# Patient Record
Sex: Male | Born: 1960 | Hispanic: Yes | Marital: Single | Smoking: Never smoker
Health system: Southern US, Community
[De-identification: ages and names within clinical notes are randomized; demographics above are authoritative.]

## PROBLEM LIST (undated history)

## (undated) DIAGNOSIS — I10 Essential (primary) hypertension: Secondary | ICD-10-CM

## (undated) DIAGNOSIS — E559 Vitamin D deficiency, unspecified: Secondary | ICD-10-CM

## (undated) DIAGNOSIS — E785 Hyperlipidemia, unspecified: Secondary | ICD-10-CM

## (undated) DIAGNOSIS — A159 Respiratory tuberculosis unspecified: Secondary | ICD-10-CM

## (undated) DIAGNOSIS — R17 Unspecified jaundice: Secondary | ICD-10-CM

## (undated) DIAGNOSIS — D751 Secondary polycythemia: Principal | ICD-10-CM

## (undated) HISTORY — DX: Essential (primary) hypertension: I10

## (undated) HISTORY — DX: Respiratory tuberculosis unspecified: A15.9

## (undated) HISTORY — DX: Unspecified jaundice: R17

## (undated) HISTORY — DX: Vitamin D deficiency, unspecified: E55.9

## (undated) HISTORY — DX: Secondary polycythemia: D75.1

## (undated) HISTORY — PX: HEMORRHOID SURGERY: SHX153

---

## 2002-06-08 ENCOUNTER — Encounter: Payer: Self-pay | Admitting: Emergency Medicine

## 2002-06-08 ENCOUNTER — Emergency Department (HOSPITAL_COMMUNITY): Admission: AD | Admit: 2002-06-08 | Discharge: 2002-06-08 | Payer: Self-pay | Admitting: Emergency Medicine

## 2006-07-21 ENCOUNTER — Emergency Department (HOSPITAL_COMMUNITY): Admission: EM | Admit: 2006-07-21 | Discharge: 2006-07-21 | Payer: Self-pay | Admitting: Emergency Medicine

## 2007-05-08 ENCOUNTER — Emergency Department (HOSPITAL_COMMUNITY): Admission: EM | Admit: 2007-05-08 | Discharge: 2007-05-08 | Payer: Self-pay | Admitting: Emergency Medicine

## 2008-01-28 ENCOUNTER — Emergency Department (HOSPITAL_COMMUNITY): Admission: EM | Admit: 2008-01-28 | Discharge: 2008-01-28 | Payer: Self-pay | Admitting: Emergency Medicine

## 2008-03-10 ENCOUNTER — Ambulatory Visit (HOSPITAL_COMMUNITY): Admission: RE | Admit: 2008-03-10 | Discharge: 2008-03-10 | Payer: Self-pay | Admitting: Family Medicine

## 2009-05-05 ENCOUNTER — Emergency Department (HOSPITAL_COMMUNITY): Admission: EM | Admit: 2009-05-05 | Discharge: 2009-05-06 | Payer: Self-pay | Admitting: Emergency Medicine

## 2010-04-12 LAB — URINALYSIS, ROUTINE W REFLEX MICROSCOPIC
Bilirubin Urine: NEGATIVE
Ketones, ur: NEGATIVE mg/dL
Nitrite: NEGATIVE
Urobilinogen, UA: 2 mg/dL — ABNORMAL HIGH (ref 0.0–1.0)
pH: 6.5 (ref 5.0–8.0)

## 2010-05-08 LAB — CBC
Hemoglobin: 16.1 g/dL (ref 13.0–17.0)
MCHC: 34.8 g/dL (ref 30.0–36.0)
MCV: 90.3 fL (ref 78.0–100.0)
Platelets: 227 10*3/uL (ref 150–400)

## 2010-05-08 LAB — DIFFERENTIAL
Lymphocytes Relative: 36 % (ref 12–46)
Lymphs Abs: 2.3 10*3/uL (ref 0.7–4.0)
Monocytes Relative: 11 % (ref 3–12)
Neutrophils Relative %: 52 % (ref 43–77)

## 2010-05-08 LAB — BASIC METABOLIC PANEL
CO2: 25 mEq/L (ref 19–32)
Calcium: 8.7 mg/dL (ref 8.4–10.5)
Chloride: 105 mEq/L (ref 96–112)
Glucose, Bld: 133 mg/dL — ABNORMAL HIGH (ref 70–99)
Sodium: 138 mEq/L (ref 135–145)

## 2010-05-08 LAB — POCT CARDIAC MARKERS
CKMB, poc: 2.6 ng/mL (ref 1.0–8.0)
Troponin i, poc: <0.05 ng/mL (ref 0.00–0.09)

## 2010-05-08 LAB — D-DIMER, QUANTITATIVE: D-Dimer, Quant: 0.22 ug/mL-FEU (ref 0.00–0.48)

## 2011-03-15 ENCOUNTER — Encounter: Payer: Self-pay | Admitting: Physician Assistant

## 2011-03-15 ENCOUNTER — Ambulatory Visit (INDEPENDENT_AMBULATORY_CARE_PROVIDER_SITE_OTHER): Payer: 59 | Admitting: Physician Assistant

## 2011-03-15 VITALS — BP 135/92 | HR 77 | Temp 98.3°F | Resp 16 | Ht 68.5 in | Wt 173.6 lb

## 2011-03-15 DIAGNOSIS — K648 Other hemorrhoids: Secondary | ICD-10-CM

## 2011-03-15 DIAGNOSIS — I10 Essential (primary) hypertension: Secondary | ICD-10-CM | POA: Insufficient documentation

## 2011-03-15 DIAGNOSIS — K649 Unspecified hemorrhoids: Secondary | ICD-10-CM | POA: Insufficient documentation

## 2011-03-15 LAB — POCT CBC
Granulocyte percent: 49.7 %G (ref 37–80)
HCT, POC: 49.8 % (ref 43.5–53.7)
Hemoglobin: 16.8 g/dL (ref 14.1–18.1)
POC Granulocyte: 4.3 (ref 2–6.9)
RBC: 5.38 M/uL (ref 4.69–6.13)

## 2011-03-15 LAB — IFOBT (OCCULT BLOOD): IFOBT: NEGATIVE

## 2011-03-15 MED ORDER — LISINOPRIL 10 MG PO TABS: 10.0000 mg | ORAL_TABLET | Freq: Every day | ORAL | Status: DC

## 2011-03-15 MED ORDER — IBUPROFEN 800 MG PO TABS: 800.0000 mg | ORAL_TABLET | Freq: Three times a day (TID) | ORAL | Status: DC | PRN

## 2011-03-15 NOTE — Progress Notes (Signed)
Patient ID: Kenneth Wilkerson MRN: [id number], DOB: 1961-01-16, 51 y.o. Date of Encounter: 03/15/2011, 5:09 PM  Primary Physician: No primary provider on file.  Chief Complaint: Hypertension follow up  HPI: 51 y.o. year old male with history below presents for follow up of hypertension. Out of lisinopril for 1 month. BP at home usually runs 120's/80's when he has his medication. No headache, CP, vision changes, or focal deficits. No ETOH, illicit substances, or tobacco use. Tries to eat healthy.  Patient also mentions history of hemorrhoids with BRBPR. None currently, although he did have some blood on his toilet tissue the previous week. No blood mixed with the stool. No pain. He has been constipated lately, having to strain with each BM. He reports having a colonoscopy 2-3 years prior for the same issue which showed hemorrhoids.     Past Medical History  Diagnosis Date  . Hypertension   . Hemorrhoids      Home Meds: Prior to Admission medications   Medication Sig Start Date End Date Taking? Authorizing Provider  ibuprofen (ADVIL,MOTRIN) 200 MG tablet Take 200 mg by mouth as needed.   Yes Historical Provider, MD  lisinopril (PRINIVIL,ZESTRIL) 10 MG tablet Take 10 mg by mouth daily.   Yes Historical Provider, MD    Allergies:  Allergies  Allergen Reactions  . Darvocet (Propoxyphene N-Acetaminophen) Other (See Comments)    HA, NERVOUSNESS  . Sulfa Antibiotics Other (See Comments)    Dizziness, shakey, sweats    History   Social History  . Marital Status: Married    Spouse Name: N/A    Number of Children: N/A  . Years of Education: N/A   Occupational History  . Not on file.   Social History Main Topics  . Smoking status: Not on file  . Smokeless tobacco: Not on file  . Alcohol Use: Not on file  . Drug Use: Not on file  . Sexually Active: Not on file   Other Topics Concern  . Not on file   Social History Narrative  . No narrative on file     Review of  Systems: Constitutional: negative for chills, fever, night sweats, weight changes, or fatigue  HEENT: negative for vision changes, hearing loss, congestion, rhinorrhea, ST, epistaxis, or sinus pressure Cardiovascular: negative for chest pain or palpitations Respiratory: negative for hemoptysis, wheezing, shortness of breath, or cough Abdominal: negative for abdominal pain, nausea, vomiting, or diarrhea Dermatological: negative for rash Neurologic: negative for headache, dizziness, or syncope All other systems reviewed and are otherwise negative with the exception to those above and in the HPI.   Physical Exam: Blood pressure 135/92, pulse 77, temperature 98.3 F (36.8 C), temperature source Oral, resp. rate 16, height 5' 8.5" (1.74 m), weight 173 lb 9.6 oz (78.744 kg)., Body mass index is 26.01 kg/(m^2). General: Well developed, well nourished, in no acute distress. Head: Normocephalic, atraumatic, eyes without discharge, sclera non-icteric, nares are without discharge. Bilateral auditory canals clear, TM's are without perforation, pearly grey and translucent with reflective cone of light bilaterally. Oral cavity moist, posterior pharynx without exudate, erythema, peritonsillar abscess, or post nasal drip.  Neck: Supple. No thyromegaly. Full ROM. No lymphadenopathy. Lungs: Clear bilaterally to auscultation without wheezes, rales, or rhonchi. Breathing is unlabored. Heart: RRR with S1 S2. No murmurs, rubs, or gallops appreciated. Abdomen: Soft, non-tender, non-distended with normoactive bowel sounds. No hepatosplenomegaly. No rebound/guarding. No obvious abdominal masses. Rectal: Prostate symmetrical without nodules. No frank blood on glove. Stool brown.  Msk:  Strength and tone normal for age. Extremities/Skin: Warm and dry. No clubbing or cyanosis. No edema. No rashes or suspicious lesions. Neuro: Alert and oriented X 3. Moves all extremities spontaneously. Gait is normal. CNII-XII grossly in  tact. Psych:  Responds to questions appropriately with a normal affect.   Labs: Results for orders placed in visit on 03/15/11  POCT CBC      Component Value Range   WBC 8.6  4.6 - 10.2 (K/uL)   Lymph, poc 3.7 (*) 0.6 - 3.4    POC LYMPH PERCENT 43.2  10 - 50 (%L)   MID (cbc) 0.6  0 - 0.9    POC MID % 7.1  0 - 12 (%M)   POC Granulocyte 4.3  2 - 6.9    Granulocyte percent 49.7  37 - 80 (%G)   RBC 5.38  4.69 - 6.13 (M/uL)   Hemoglobin 16.8  14.1 - 18.1 (g/dL)   HCT, POC 16.1  09.6 - 53.7 (%)   MCV 92.6  80 - 97 (fL)   MCH, POC 31.2  27 - 31.2 (pg)   MCHC 33.7  31.8 - 35.4 (g/dL)   RDW, POC 04.5     Platelet Count, POC 249  142 - 424 (K/uL)   MPV 8.5  0 - 99.8 (fL)  IFOBT (OCCULT BLOOD)      Component Value Range   IFOBT Negative      BMP pending  ASSESSMENT AND PLAN:  51 y.o. year old male with hypertension and 1. Hypertension -Restart lisinopril 10 mg #90 1 po daily RF 2 -Healthy diet and exercise -Await BMP -Recheck 6 months for CPE with fasting labs  2. Hemorrhoids -Stable/no active bleeding -Start Miralax -Push water -Declines GI eval  -If blood returns RTC for evaluation and treatment -RTC/ER precautions  Signed, Eula Listen, PA-C 03/15/2011 5:09 PM

## 2011-03-15 NOTE — Patient Instructions (Signed)

## 2011-03-16 LAB — BASIC METABOLIC PANEL
CO2: 22 mEq/L (ref 19–32)
Chloride: 103 mEq/L (ref 96–112)
Glucose, Bld: 85 mg/dL (ref 70–99)
Potassium: 4.3 mEq/L (ref 3.5–5.3)
Sodium: 137 mEq/L (ref 135–145)

## 2012-03-04 ENCOUNTER — Ambulatory Visit (INDEPENDENT_AMBULATORY_CARE_PROVIDER_SITE_OTHER): Payer: 59 | Admitting: Emergency Medicine

## 2012-03-04 VITALS — BP 148/87 | HR 69 | Temp 98.2°F | Resp 16 | Ht 68.25 in | Wt 170.2 lb

## 2012-03-04 DIAGNOSIS — I10 Essential (primary) hypertension: Secondary | ICD-10-CM

## 2012-03-04 DIAGNOSIS — R55 Syncope and collapse: Secondary | ICD-10-CM

## 2012-03-04 DIAGNOSIS — R300 Dysuria: Secondary | ICD-10-CM

## 2012-03-04 DIAGNOSIS — Z0000 Encounter for general adult medical examination without abnormal findings: Secondary | ICD-10-CM

## 2012-03-04 DIAGNOSIS — Z23 Encounter for immunization: Secondary | ICD-10-CM

## 2012-03-04 LAB — COMPREHENSIVE METABOLIC PANEL
ALT: 28 U/L (ref 0–53)
AST: 27 U/L (ref 0–37)
Albumin: 4.7 g/dL (ref 3.5–5.2)
Calcium: 9.4 mg/dL (ref 8.4–10.5)
Chloride: 102 mEq/L (ref 96–112)
Potassium: 4 mEq/L (ref 3.5–5.3)
Sodium: 135 mEq/L (ref 135–145)

## 2012-03-04 LAB — LIPID PANEL: Cholesterol: 184 mg/dL (ref 0–200)

## 2012-03-04 LAB — POCT URINALYSIS DIPSTICK
Ketones, UA: NEGATIVE
Leukocytes, UA: NEGATIVE
Protein, UA: NEGATIVE
Urobilinogen, UA: 0.2

## 2012-03-04 LAB — POCT CBC
Granulocyte percent: 51.2 %G (ref 37–80)
MCV: 94.7 fL (ref 80–97)
MID (cbc): 0.6 (ref 0–0.9)
POC Granulocyte: 3.3 (ref 2–6.9)
POC LYMPH PERCENT: 40.3 %L (ref 10–50)
Platelet Count, POC: 250 10*3/uL (ref 142–424)
RDW, POC: 14 %

## 2012-03-04 LAB — POCT UA - MICROSCOPIC ONLY
Crystals, Ur, HPF, POC: NEGATIVE
RBC, urine, microscopic: NEGATIVE

## 2012-03-04 LAB — TSH: TSH: 1.282 u[IU]/mL (ref 0.350–4.500)

## 2012-03-04 MED ORDER — LISINOPRIL 10 MG PO TABS: 20.0000 mg | ORAL_TABLET | Freq: Every day | ORAL | Status: DC

## 2012-03-04 MED ORDER — IBUPROFEN 800 MG PO TABS: 800.0000 mg | ORAL_TABLET | Freq: Three times a day (TID) | ORAL | Status: AC | PRN

## 2012-03-04 NOTE — Progress Notes (Signed)
[facility] [address] [place] [address] [phone]  Date:  03/04/2012   Name:  Kenneth Wilkerson   DOB:  09-20-1960   MRN:  [id number]  PCP:  No primary provider on file.    Chief Complaint: Annual Exam   History of Present Illness:  Kenneth Wilkerson is a 52 y.o. very pleasant male patient who presents with the following:  For wellness examination.  Tetanus out of date.  Has dysuria, urgency and frequency.  No fever or chills.  Some bilateral CVA burning.  Started last week.  No nausea or vomiting.  No symptoms prostatism.  Had a syncopal episode at Gym a month ago while working out.  Denies chest pain, just became dizzy and fell over.    Patient Active Problem List  Diagnosis  . Hypertension  . Hemorrhoids    Past Medical History  Diagnosis Date  . Hypertension   . Hemorrhoids     History reviewed. No pertinent past surgical history.  History  Substance Use Topics  . Smoking status: Never Smoker   . Smokeless tobacco: Not on file  . Alcohol Use: No    History reviewed. No pertinent family history.  Allergies  Allergen Reactions  . Darvocet (Propoxyphene-Acetaminophen) Other (See Comments)    HA, NERVOUSNESS  . Sulfa Antibiotics Other (See Comments)    Dizziness, shakey, sweats    Medication list has been reviewed and updated.  Current Outpatient Prescriptions on File Prior to Visit  Medication Sig Dispense Refill  . lisinopril (PRINIVIL,ZESTRIL) 10 MG tablet Take 1 tablet (10 mg total) by mouth daily.  90 tablet  2   No current facility-administered medications on file prior to visit.    Review of Systems:  As per HPI, otherwise negative.    Physical Examination: Filed Vitals:   03/04/12 0943  BP: 148/87  Pulse: 69  Temp: 98.2 F (36.8 C)  Resp: 16   Filed Vitals:   03/04/12 0943  Height: 5' 8.25" (1.734 m)  Weight: 170 lb 3.2 oz (77.202 kg)   Body mass index is 25.68 kg/(m^2). Ideal Body Weight:  Weight in (lb) to have BMI = 25: 165.3  GEN: WDWN, NAD, Non-toxic, A & O x 3 HEENT: Atraumatic, Normocephalic. Neck supple. No masses, No LAD. Ears and Nose: No external deformity. CV: RRR, No M/G/R. No JVD. No thrill. No extra heart sounds. PULM: CTA B, no wheezes, crackles, rhonchi. No retractions. No resp. distress. No accessory muscle use. ABD: S, NT, ND, +BS. No rebound. No HSM. EXTR: No c/c/e NEURO Normal gait.  PSYCH: Normally interactive. Conversant. Not depressed or anxious appearing.  Calm demeanor.  RECTAL:  Normal male circumcised.  Normal prostate.  Heme pending  Assessment and Plan: Wellness examination Hypertension Needs TDAP Needs colonosocopy Follow up based on labs Cardiology for stress test  Kenneth Dane, MD

## 2012-03-05 LAB — VITAMIN D 25 HYDROXY (VIT D DEFICIENCY, FRACTURES): Vit D, 25-Hydroxy: 21 ng/mL — ABNORMAL LOW (ref 30–89)

## 2012-03-06 NOTE — Progress Notes (Signed)
Reviewed and agree.

## 2012-03-07 MED ORDER — ERGOCALCIFEROL 1.25 MG (50000 UT) PO CAPS: 50000.0000 [IU] | ORAL_CAPSULE | ORAL | Status: DC

## 2012-03-07 NOTE — Addendum Note (Signed)
Addended by: Carmelina Dane on: 03/07/2012 10:01 AM   Modules accepted: Orders

## 2012-03-21 ENCOUNTER — Other Ambulatory Visit (HOSPITAL_COMMUNITY): Payer: Self-pay | Admitting: Cardiovascular Disease

## 2012-03-21 DIAGNOSIS — R079 Chest pain, unspecified: Secondary | ICD-10-CM

## 2012-03-21 DIAGNOSIS — R55 Syncope and collapse: Secondary | ICD-10-CM

## 2012-03-21 DIAGNOSIS — R011 Cardiac murmur, unspecified: Secondary | ICD-10-CM

## 2012-04-23 ENCOUNTER — Encounter (HOSPITAL_COMMUNITY): Payer: Self-pay

## 2012-04-23 ENCOUNTER — Ambulatory Visit (HOSPITAL_COMMUNITY): Payer: Self-pay

## 2012-12-16 ENCOUNTER — Ambulatory Visit (INDEPENDENT_AMBULATORY_CARE_PROVIDER_SITE_OTHER): Payer: 59 | Admitting: Physician Assistant

## 2012-12-16 VITALS — BP 124/84 | HR 87 | Temp 99.1°F | Resp 16 | Ht 68.3 in | Wt 182.2 lb

## 2012-12-16 DIAGNOSIS — J029 Acute pharyngitis, unspecified: Secondary | ICD-10-CM

## 2012-12-16 DIAGNOSIS — E785 Hyperlipidemia, unspecified: Secondary | ICD-10-CM

## 2012-12-16 DIAGNOSIS — R059 Cough, unspecified: Secondary | ICD-10-CM

## 2012-12-16 DIAGNOSIS — R05 Cough: Secondary | ICD-10-CM

## 2012-12-16 DIAGNOSIS — H6690 Otitis media, unspecified, unspecified ear: Secondary | ICD-10-CM

## 2012-12-16 LAB — COMPREHENSIVE METABOLIC PANEL
ALT: 23 U/L (ref 0–53)
AST: 24 U/L (ref 0–37)
Albumin: 4.6 g/dL (ref 3.5–5.2)
Alkaline Phosphatase: 83 U/L (ref 39–117)
BUN: 15 mg/dL (ref 6–23)
CO2: 26 mEq/L (ref 19–32)
Calcium: 9.4 mg/dL (ref 8.4–10.5)
Chloride: 99 mEq/L (ref 96–112)
Creat: 0.69 mg/dL (ref 0.50–1.35)
Glucose, Bld: 94 mg/dL (ref 70–99)
Potassium: 4.1 mEq/L (ref 3.5–5.3)
Sodium: 135 mEq/L (ref 135–145)
Total Bilirubin: 1.2 mg/dL (ref 0.3–1.2)
Total Protein: 8.2 g/dL (ref 6.0–8.3)

## 2012-12-16 LAB — LIPID PANEL: LDL Cholesterol: 95 mg/dL (ref 0–99)

## 2012-12-16 MED ORDER — AMOXICILLIN 875 MG PO TABS: 875.0000 mg | ORAL_TABLET | Freq: Two times a day (BID) | ORAL | Status: DC

## 2012-12-16 NOTE — Patient Instructions (Signed)
Get plenty of rest and drink at least 64 ounces of water daily. Use acetaminophen or ibuprofen for throat pain.

## 2012-12-16 NOTE — Progress Notes (Signed)
  [address]  [place], [place] [address]  [phone]  [email or web address]  Subjective:    Patient ID: Kenneth Wilkerson, male    DOB: 03-30-1960, 52 y.o.   MRN: [id number]  HPI  Chief Complaint  Patient presents with  . Sore Throat    3 days  . Cough   The cough is non-productive, and he has a little bit of nasal congestion and drainage.  He describes HA, soreness in the upper back and neck when he coughs.  No ear pain or fullness.  No SOB, dizziness.  No GI/GU symptoms. No other myalgias/arthralgias.  No rash.  Medications, allergies, past medical history, surgical history, family history, social history and problem list reviewed.  He requests fasting lipids today-has been told his cholesterol was too high by a provider elsewhere, and that he should work on healthier eating and exercise for several months before starting medication.   Review of Systems As above.    Objective:   Physical Exam  Blood pressure 124/84, pulse 87, temperature 99.1 F (37.3 C), temperature source Oral, resp. rate 16, height 5' 8.3" (1.735 m), weight 182 lb 3.2 oz (82.645 kg), SpO2 98.00%. Body mass index is 27.45 kg/(m^2). Well-developed, well nourished [place] man who is awake, alert and oriented, in NAD. HEENT: [place]/AT, PERRL, EOMI.  Sclera and conjunctiva are clear.  EAC are patent, RIGHT TM is normal in appearance. The LEFT TM is opaque in the upper field, with mild injection. Nasal mucosa is pink and moist. OP is clear. Neck: supple, non-tender, no lymphadenopathy, thyromegaly. Heart: RRR, no murmur Lungs: normal effort, CTA Extremities: no cyanosis, clubbing or edema. Skin: warm and dry without rash. Psychologic: good mood and appropriate affect, normal speech and behavior.       Assessment & Plan:  Acute pharyngitis  Cough  AOM (acute otitis media), left - Plan: amoxicillin (AMOXIL) 875 MG tablet  Hyperlipidemia - Plan: Lipid panel, Comprehensive metabolic panel  Get plenty of  rest and drink at least 64 ounces of water daily.  Fernande Bras, PA-C Physician Assistant-Certified [facility]

## 2012-12-18 ENCOUNTER — Emergency Department (HOSPITAL_COMMUNITY): Payer: 59

## 2012-12-18 ENCOUNTER — Encounter (HOSPITAL_COMMUNITY): Payer: Self-pay | Admitting: Emergency Medicine

## 2012-12-18 ENCOUNTER — Emergency Department (HOSPITAL_COMMUNITY)
Admission: EM | Admit: 2012-12-18 | Discharge: 2012-12-18 | Disposition: A | Discharge status: Home / Self Care | Payer: 59 | Attending: Emergency Medicine | Admitting: Emergency Medicine

## 2012-12-18 DIAGNOSIS — I10 Essential (primary) hypertension: Secondary | ICD-10-CM | POA: Insufficient documentation

## 2012-12-18 DIAGNOSIS — J111 Influenza due to unidentified influenza virus with other respiratory manifestations: Secondary | ICD-10-CM

## 2012-12-18 DIAGNOSIS — Z862 Personal history of diseases of the blood and blood-forming organs and certain disorders involving the immune mechanism: Secondary | ICD-10-CM | POA: Insufficient documentation

## 2012-12-18 DIAGNOSIS — R059 Cough, unspecified: Secondary | ICD-10-CM | POA: Insufficient documentation

## 2012-12-18 DIAGNOSIS — IMO0001 Reserved for inherently not codable concepts without codable children: Secondary | ICD-10-CM | POA: Insufficient documentation

## 2012-12-18 DIAGNOSIS — Z79899 Other long term (current) drug therapy: Secondary | ICD-10-CM | POA: Insufficient documentation

## 2012-12-18 DIAGNOSIS — Z792 Long term (current) use of antibiotics: Secondary | ICD-10-CM | POA: Insufficient documentation

## 2012-12-18 DIAGNOSIS — Z8639 Personal history of other endocrine, nutritional and metabolic disease: Secondary | ICD-10-CM | POA: Insufficient documentation

## 2012-12-18 DIAGNOSIS — Z8679 Personal history of other diseases of the circulatory system: Secondary | ICD-10-CM | POA: Insufficient documentation

## 2012-12-18 DIAGNOSIS — J029 Acute pharyngitis, unspecified: Secondary | ICD-10-CM | POA: Insufficient documentation

## 2012-12-18 DIAGNOSIS — R5381 Other malaise: Secondary | ICD-10-CM | POA: Insufficient documentation

## 2012-12-18 DIAGNOSIS — R05 Cough: Secondary | ICD-10-CM | POA: Insufficient documentation

## 2012-12-18 HISTORY — DX: Hyperlipidemia, unspecified: E78.5

## 2012-12-18 MED ORDER — HYDROCODONE-HOMATROPINE 5-1.5 MG/5ML PO SYRP
5.0000 mL | ORAL_SOLUTION | Freq: Once | ORAL | Status: AC
  Administered 2012-12-18: 5 mL via ORAL
  Filled 2012-12-18: qty 5

## 2012-12-18 MED ORDER — IBUPROFEN 600 MG PO TABS: 600.0000 mg | ORAL_TABLET | Freq: Four times a day (QID) | ORAL | Status: DC | PRN

## 2012-12-18 MED ORDER — HYDROCODONE-HOMATROPINE 5-1.5 MG/5ML PO SYRP: 5.0000 mL | ORAL_SOLUTION | Freq: Four times a day (QID) | ORAL | Status: DC | PRN

## 2012-12-18 MED ORDER — IBUPROFEN 400 MG PO TABS
600.0000 mg | ORAL_TABLET | Freq: Once | ORAL | Status: AC
  Administered 2012-12-18: 600 mg via ORAL
  Filled 2012-12-18: qty 2

## 2012-12-18 NOTE — ED Provider Notes (Signed)
CSN: [id number]     Arrival date & time 12/18/12  0117 History   First MD Initiated Contact with Patient 12/18/12 0158     No chief complaint on file.  (Consider location/radiation/quality/duration/timing/severity/associated sxs/prior Treatment) HPI Patient presents with one week of nonproductive cough, diffuse body aches, sore throat, fatigue. Was seen several days ago the urgent care clinic and prescribed amoxicillin. Patient states the symptoms have not improved since taking the amoxicillin. He's had no known sick contacts or recent international travel. He states he did not get the flu shot this year. He's had no nausea, vomiting or diarrhea. He denies any fevers or chills. He's had no neck pain or stiffness. Past Medical History  Diagnosis Date  . Hypertension   . Hemorrhoids   . Vitamin D deficiency   . Hyperlipidemia    Past Surgical History  Procedure Laterality Date  . Hemorrhoid surgery     History reviewed. No pertinent family history. History  Substance Use Topics  . Smoking status: Never Smoker   . Smokeless tobacco: Never Used  . Alcohol Use: 0 - 1 oz/week    0-2 drink(s) per week    Review of Systems  Constitutional: Positive for fatigue. Negative for fever and chills.  HENT: Positive for sore throat.   Respiratory: Positive for cough. Negative for chest tightness and shortness of breath.   Cardiovascular: Negative for chest pain, palpitations and leg swelling.  Gastrointestinal: Negative for nausea, vomiting, abdominal pain, diarrhea and constipation.  Genitourinary: Negative for dysuria and frequency.  Musculoskeletal: Positive for myalgias. Negative for back pain, neck pain and neck stiffness.  Skin: Negative for rash and wound.  Neurological: Negative for dizziness, syncope, weakness, numbness and headaches.  All other systems reviewed and are negative.    Allergies  Darvocet and Sulfa antibiotics  Home Medications   Current Outpatient Rx  Name   Route  Sig  Dispense  Refill  . amoxicillin (AMOXIL) 875 MG tablet   Oral   Take 1 tablet (875 mg total) by mouth 2 (two) times daily.   20 tablet   0   . lisinopril (PRINIVIL,ZESTRIL) 10 MG tablet   Oral   Take 2 tablets (20 mg total) by mouth daily.   90 tablet   1    BP 136/91  Pulse 86  Temp(Src) 98.5 F (36.9 C) (Oral)  Resp 20  Ht 5\' 7"  (1.702 m)  Wt 172 lb (78.019 kg)  BMI 26.93 kg/m2  SpO2 98% Physical Exam  Nursing note and vitals reviewed. Constitutional: He is oriented to person, place, and time. He appears well-developed and well-nourished. No distress.  HENT:  Head: Normocephalic and atraumatic.  Mouth/Throat: Oropharynx is clear and moist. No oropharyngeal exudate.  Erythematous posterior oropharynx without exudates or tonsillar hypertrophy.  Eyes: EOM are normal. Pupils are equal, round, and reactive to light.  Neck: Normal range of motion. Neck supple.  No meningismus  Cardiovascular: Normal rate and regular rhythm.   Pulmonary/Chest: Effort normal and breath sounds normal. No respiratory distress. He has no wheezes. He has no rales. He exhibits no tenderness.  Abdominal: Soft. Bowel sounds are normal. He exhibits no distension and no mass. There is no tenderness. There is no rebound and no guarding.  Musculoskeletal: Normal range of motion. He exhibits no edema and no tenderness.  No CVA tenderness.  Lymphadenopathy:    He has no cervical adenopathy.  Neurological: He is alert and oriented to person, place, and time.  Patient is alert and  oriented x3 with clear, goal oriented speech. Patient has 5/5 motor in all extremities. Sensation is intact to light touch. Patient has a normal gait and walks without assistance.   Skin: Skin is warm and dry. No rash noted. No erythema.  Psychiatric: He has a normal mood and affect. His behavior is normal.    ED Course  Procedures (including critical care time) Labs Review Labs Reviewed - No data to  display Imaging Review No results found.  EKG Interpretation   None       MDM  Likely viral illness such as influenza. Will check a chest x-ray to rule out pneumonia. We'll treat symptomatically.   Loren Racer, MD 12/19/12 0200

## 2012-12-18 NOTE — ED Notes (Signed)
Pt reporting cough and body aches for several days.  Saw PCP on 11/25.  Pt was started amoxicillin and reports no relief.

## 2013-03-23 ENCOUNTER — Ambulatory Visit (INDEPENDENT_AMBULATORY_CARE_PROVIDER_SITE_OTHER): Payer: 59 | Admitting: Emergency Medicine

## 2013-03-23 VITALS — BP 122/70 | HR 71 | Temp 97.8°F | Resp 16 | Ht 69.0 in | Wt 172.0 lb

## 2013-03-23 DIAGNOSIS — I10 Essential (primary) hypertension: Secondary | ICD-10-CM

## 2013-03-23 DIAGNOSIS — Z0000 Encounter for general adult medical examination without abnormal findings: Secondary | ICD-10-CM

## 2013-03-23 DIAGNOSIS — D751 Secondary polycythemia: Secondary | ICD-10-CM

## 2013-03-23 DIAGNOSIS — E785 Hyperlipidemia, unspecified: Secondary | ICD-10-CM

## 2013-03-23 LAB — POCT CBC
Granulocyte percent: 48.6 %G (ref 37–80)
HEMATOCRIT: 56.3 % — AB (ref 43.5–53.7)
Hemoglobin: 18.7 g/dL — AB (ref 14.1–18.1)
LYMPH, POC: 2.8 (ref 0.6–3.4)
MCH, POC: 32.2 pg — AB (ref 27–31.2)
MCHC: 33.2 g/dL (ref 31.8–35.4)
MCV: 96.9 fL (ref 80–97)
MID (CBC): 0.6 (ref 0–0.9)
MPV: 8.8 fL (ref 0–99.8)
PLATELET COUNT, POC: 246 10*3/uL (ref 142–424)
POC GRANULOCYTE: 3.2 (ref 2–6.9)
POC LYMPH %: 42.6 % (ref 10–50)
POC MID %: 8.8 %M (ref 0–12)
RBC: 5.81 M/uL (ref 4.69–6.13)
RDW, POC: 14.2 %
WBC: 6.6 10*3/uL (ref 4.6–10.2)

## 2013-03-23 LAB — POCT URINALYSIS DIPSTICK
Bilirubin, UA: NEGATIVE
Blood, UA: NEGATIVE
Glucose, UA: NEGATIVE
KETONES UA: NEGATIVE
Leukocytes, UA: NEGATIVE
Nitrite, UA: NEGATIVE
PH UA: 7
PROTEIN UA: NEGATIVE
SPEC GRAV UA: 1.015
Urobilinogen, UA: 0.2

## 2013-03-23 LAB — LIPID PANEL
CHOL/HDL RATIO: 4.1 ratio
CHOLESTEROL: 184 mg/dL (ref 0–200)
HDL: 45 mg/dL (ref >39)
LDL Cholesterol: 127 mg/dL — ABNORMAL HIGH (ref 0–99)
TRIGLYCERIDES: 60 mg/dL (ref <150)
VLDL: 12 mg/dL (ref 0–40)

## 2013-03-23 LAB — COMPREHENSIVE METABOLIC PANEL
ALT: 32 U/L (ref 0–53)
AST: 27 U/L (ref 0–37)
Albumin: 4.7 g/dL (ref 3.5–5.2)
Alkaline Phosphatase: 73 U/L (ref 39–117)
BILIRUBIN TOTAL: 2 mg/dL — AB (ref 0.2–1.2)
BUN: 22 mg/dL (ref 6–23)
CO2: 27 meq/L (ref 19–32)
CREATININE: 0.72 mg/dL (ref 0.50–1.35)
Calcium: 9.7 mg/dL (ref 8.4–10.5)
Chloride: 97 mEq/L (ref 96–112)
GLUCOSE: 71 mg/dL (ref 70–99)
Potassium: 4.4 mEq/L (ref 3.5–5.3)
Sodium: 135 mEq/L (ref 135–145)
Total Protein: 8.6 g/dL — ABNORMAL HIGH (ref 6.0–8.3)

## 2013-03-23 MED ORDER — LISINOPRIL 20 MG PO TABS: 20.0000 mg | ORAL_TABLET | Freq: Every day | ORAL | Status: DC

## 2013-03-23 MED ORDER — MELOXICAM 15 MG PO TABS: 15.0000 mg | ORAL_TABLET | Freq: Every day | ORAL | Status: DC

## 2013-03-23 NOTE — Patient Instructions (Signed)

## 2013-03-23 NOTE — Progress Notes (Addendum)
[facility] [address], [place] [id number] [phone]  Date:  03/23/2013   Name:  Kenneth SorrelSebastian R Shek   DOB:  10/05/1960   MRN:  [id number]  PCP:  Patrena Santalucia,CHELLE, PA-C    Chief Complaint: Annual Exam and Back Pain   History of Present Illness:  Kenneth SorrelSebastian R Rohlfs is a 53 y.o. very pleasant male patient who presents with the following:  For wellness exam.  Under treatment for hypertension.  Had colonoscopy but did not accomplish stress test due concerns about what he was required to pay.  No further syncopal episodes. Has pain in both elbows with lifting  No improvement with over the counter medications or other home remedies. Denies other complaint or health concern today.   Patient Active Problem List   Diagnosis Date Noted  . Hypertension   . Hemorrhoids     Past Medical History  Diagnosis Date  . Hypertension   . Hemorrhoids   . Vitamin D deficiency   . Hyperlipidemia   . Tuberculosis     Treated for in 2000    Past Surgical History  Procedure Laterality Date  . Hemorrhoid surgery      History  Substance Use Topics  . Smoking status: Never Smoker   . Smokeless tobacco: Never Used  . Alcohol Use: 0 - 1 oz/week    0-2 drink(s) per week    History reviewed. No pertinent family history.  Allergies  Allergen Reactions  . Darvocet [Propoxyphene N-Acetaminophen] Other (See Comments)    HA, NERVOUSNESS  . Sulfa Antibiotics Other (See Comments)    Dizziness, shakey, sweats    Medication list has been reviewed and updated.  Current Outpatient Prescriptions on File Prior to Visit  Medication Sig Dispense Refill  . ibuprofen (ADVIL,MOTRIN) 600 MG tablet Take 1 tablet (600 mg total) by mouth every 6 (six) hours as needed.  30 tablet  0  . amoxicillin (AMOXIL) 875 MG tablet Take 1 tablet (875 mg total) by mouth 2 (two) times daily.  20 tablet  0  . HYDROcodone-homatropine (HYCODAN) 5-1.5 MG/5ML syrup Take 5 mLs by mouth every 6 (six)  hours as needed for cough.  120 mL  0  . lisinopril (PRINIVIL,ZESTRIL) 10 MG tablet Take 2 tablets (20 mg total) by mouth daily.  90 tablet  1   No current facility-administered medications on file prior to visit.    Review of Systems:  As per HPI, otherwise negative.    Physical Examination: Filed Vitals:   03/23/13 0925  BP: 122/70  Pulse: 71  Temp: 97.8 F (36.6 C)  Resp: 16   Filed Vitals:   03/23/13 0925  Height: 5\' 9"  (1.753 m)  Weight: 172 lb (78.019 kg)   Body mass index is 25.39 kg/(m^2). Ideal Body Weight: Weight in (lb) to have BMI = 25: 168.9  GEN: WDWN, NAD, Non-toxic, A & O x 3 HEENT: Atraumatic, Normocephalic. Neck supple. No masses, No LAD. Ears and Nose: No external deformity. CV: RRR, No M/G/R. No JVD. No thrill. No extra heart sounds. PULM: CTA B, no wheezes, crackles, rhonchi. No retractions. No resp. distress. No accessory muscle use. ABD: S, NT, ND, +BS. No rebound. No HSM. EXTR: No c/c/e NEURO Normal gait.  PSYCH: Normally interactive. Conversant. Not depressed or anxious appearing.  Calm demeanor.    Assessment and Plan: Osteoarthritis Hypertension  Signed,  Phillips OdorJeffery Asheton Scheffler, MD   Results for orders placed in visit on 03/23/13  POCT CBC  Result Value Ref Range   WBC 6.6  4.6 - 10.2 K/uL   Lymph, poc 2.8  0.6 - 3.4   POC LYMPH PERCENT 42.6  10 - 50 %L   MID (cbc) 0.6  0 - 0.9   POC MID % 8.8  0 - 12 %M   POC Granulocyte 3.2  2 - 6.9   Granulocyte percent 48.6  37 - 80 %G   RBC 5.81  4.69 - 6.13 M/uL   Hemoglobin 18.7 (*) 14.1 - 18.1 g/dL   HCT, POC 16.1 (*) 09.6 - 53.7 %   MCV 96.9  80 - 97 fL   MCH, POC 32.2 (*) 27 - 31.2 pg   MCHC 33.2  31.8 - 35.4 g/dL   RDW, POC 04.5     Platelet Count, POC 246  142 - 424 K/uL   MPV 8.8  0 - 99.8 fL  POCT URINALYSIS DIPSTICK      Result Value Ref Range   Color, UA yellow     Clarity, UA clear     Glucose, UA neg     Bilirubin, UA neg     Ketones, UA neg     Spec Grav, UA 1.015      Blood, UA neg     pH, UA 7.0     Protein, UA neg     Urobilinogen, UA 0.2     Nitrite, UA neg     Leukocytes, UA Negative

## 2013-03-23 NOTE — Addendum Note (Signed)
Addended by: Carmelina DaneANDERSON, Gwenette Wellons S on: 03/23/2013 10:32 AM   Modules accepted: Orders

## 2013-03-25 ENCOUNTER — Telehealth: Payer: Self-pay | Admitting: Hematology and Oncology

## 2013-03-25 NOTE — Telephone Encounter (Signed)
Left pt vm in ref to np appt. °

## 2013-03-30 ENCOUNTER — Telehealth: Payer: Self-pay | Admitting: *Deleted

## 2013-03-30 NOTE — Telephone Encounter (Signed)
[facility] in [name] with a note dated 03-23-2013 from dr. Thornton PapasJeffrey Anderson for referral for polycythemia.  Instructed Barbara Cowerjason to give [facility] the [facility] [phone] number for him to call this office to schedule appointment.  Notified Hayes LudwigKim Hagerty who has been awaiting return call from patient.

## 2013-03-31 ENCOUNTER — Telehealth: Payer: Self-pay | Admitting: Hematology and Oncology

## 2013-03-31 NOTE — Telephone Encounter (Signed)
S/W PATIENT AND GAVE NEW PATIENT APPT FOR 03/17 @ 1:30 W/DR. GORSUCH REFERRING DR. Thornton PapasJEFFREY Wilkerson DX- POLYCYTHEMIA WELCOME PACKET MAILED.

## 2013-04-01 ENCOUNTER — Telehealth: Payer: Self-pay | Admitting: Hematology and Oncology

## 2013-04-01 NOTE — Telephone Encounter (Signed)
C/D 04/01/13 for appt. 04/07/13 °

## 2013-04-07 ENCOUNTER — Encounter: Payer: Self-pay | Admitting: Hematology and Oncology

## 2013-04-07 ENCOUNTER — Ambulatory Visit (HOSPITAL_BASED_OUTPATIENT_CLINIC_OR_DEPARTMENT_OTHER): Payer: 59 | Admitting: Hematology and Oncology

## 2013-04-07 ENCOUNTER — Ambulatory Visit (HOSPITAL_BASED_OUTPATIENT_CLINIC_OR_DEPARTMENT_OTHER): Payer: 59

## 2013-04-07 ENCOUNTER — Telehealth: Payer: Self-pay | Admitting: Hematology and Oncology

## 2013-04-07 ENCOUNTER — Telehealth: Payer: Self-pay | Admitting: *Deleted

## 2013-04-07 VITALS — BP 143/92 | HR 87 | Temp 98.3°F | Resp 18 | Ht 69.0 in | Wt 171.9 lb

## 2013-04-07 DIAGNOSIS — I10 Essential (primary) hypertension: Secondary | ICD-10-CM

## 2013-04-07 DIAGNOSIS — R17 Unspecified jaundice: Secondary | ICD-10-CM

## 2013-04-07 DIAGNOSIS — E559 Vitamin D deficiency, unspecified: Secondary | ICD-10-CM

## 2013-04-07 DIAGNOSIS — D751 Secondary polycythemia: Secondary | ICD-10-CM | POA: Insufficient documentation

## 2013-04-07 HISTORY — DX: Unspecified jaundice: R17

## 2013-04-07 HISTORY — DX: Secondary polycythemia: D75.1

## 2013-04-07 LAB — CBC WITH DIFFERENTIAL/PLATELET
BASO%: 0.3 % (ref 0.0–2.0)
Basophils Absolute: 0 10*3/uL (ref 0.0–0.1)
EOS%: 3.4 % (ref 0.0–7.0)
Eosinophils Absolute: 0.3 10*3/uL (ref 0.0–0.5)
HCT: 51.5 % — ABNORMAL HIGH (ref 38.4–49.9)
HGB: 17.6 g/dL — ABNORMAL HIGH (ref 13.0–17.1)
LYMPH%: 23.1 % (ref 14.0–49.0)
MCH: 30.9 pg (ref 27.2–33.4)
MCHC: 34.1 g/dL (ref 32.0–36.0)
MCV: 90.6 fL (ref 79.3–98.0)
MONO#: 1.3 10*3/uL — AB (ref 0.1–0.9)
MONO%: 13.4 % (ref 0.0–14.0)
NEUT#: 5.7 10*3/uL (ref 1.5–6.5)
NEUT%: 59.8 % (ref 39.0–75.0)
Platelets: 217 10*3/uL (ref 140–400)
RBC: 5.69 10*6/uL (ref 4.20–5.82)
RDW: 13 % (ref 11.0–14.6)
WBC: 9.6 10*3/uL (ref 4.0–10.3)
lymph#: 2.2 10*3/uL (ref 0.9–3.3)

## 2013-04-07 NOTE — Telephone Encounter (Signed)
gave pt appt for march and sent pt to labs today,left Marcelino DusterMichelle a vm regarding phlebotomy

## 2013-04-07 NOTE — Progress Notes (Signed)
[facility] CONSULT NOTE  Patient Care Team: Fernande Brashelle S [name], PA-C as PCP - General (Physician Assistant)  CHIEF COMPLAINTS/PURPOSE OF CONSULTATION:  Erythrocytosis  HISTORY OF PRESENTING ILLNESS:  Kenneth Wilkerson 53 y.o. male is here because of abnormal CBC. I have received recent blood work dated back to January 2010. His hemoglobin has varied from 16.1-18.7 with a hematocrit from 46.2-56.3. The patient complained of for a mild intermittent headaches. He also has occasional leg cramps. He denies any skin itching or night sweats. Denies prior diagnosis of blood clot. His girlfriend noted the patient snores in his sleep. There is also brief episode of intermittent episodes of apnea. The patient does not smoke. He does not use testosterone products. MEDICAL HISTORY:  Past Medical History  Diagnosis Date  . Hypertension   . Hemorrhoids   . Vitamin D deficiency   . Hyperlipidemia   . Tuberculosis     Treated for in 2000  . Erythrocytosis 04/07/2013  . Elevated bilirubin 04/07/2013    SURGICAL HISTORY: Past Surgical History  Procedure Laterality Date  . Hemorrhoid surgery      SOCIAL HISTORY: History   Social History  . Marital Status: Legally Separated    Spouse Name: N/A    Number of Children: 5  . Years of Education: 5th grade   Occupational History  . Housekeeping    Social History Main Topics  . Smoking status: Never Smoker   . Smokeless tobacco: Never Used  . Alcohol Use: 0 - 1 oz/week    0-2 drink(s) per week  . Drug Use: No  . Sexual Activity: Not on file   Other Topics Concern  . Not on file   Social History Narrative   Lives with his girlfriend.  His wife (they are separated) and 5 children live in [place].   [place]  Arrived in [place] in 1997.    FAMILY HISTORY: History reviewed. No pertinent family history.  ALLERGIES:  is allergic to amoxicillin; darvocet; and sulfa antibiotics.  MEDICATIONS:  Current  Outpatient Prescriptions  Medication Sig Dispense Refill  . ibuprofen (ADVIL,MOTRIN) 200 MG tablet Take 200 mg by mouth every 6 (six) hours as needed.      Marland Kitchen. lisinopril (PRINIVIL,ZESTRIL) 20 MG tablet Take 1 tablet (20 mg total) by mouth daily.  90 tablet  3   No current facility-administered medications for this visit.    REVIEW OF SYSTEMS:   Constitutional: Denies fevers, chills or abnormal night sweats Eyes: Denies blurriness of vision, double vision or watery eyes Ears, nose, mouth, throat, and face: Denies mucositis or sore throat Respiratory: Denies cough, dyspnea or wheezes Cardiovascular: Denies palpitation, chest discomfort or lower extremity swelling Gastrointestinal:  Denies nausea, heartburn or change in bowel habits Skin: Denies abnormal skin rashes Lymphatics: Denies new lymphadenopathy or easy bruising Neurological:Denies numbness, tingling or new weaknesses Behavioral/Psych: Mood is stable, no new changes  All other systems were reviewed with the patient and are negative.  PHYSICAL EXAMINATION: ECOG PERFORMANCE STATUS: 0 - Asymptomatic  Filed Vitals:   04/07/13 1328  BP: 143/92  Pulse: 87  Temp: 98.3 F (36.8 C)  Resp: 18   Filed Weights   04/07/13 1328  Weight: 171 lb 14.4 oz (77.973 kg)    GENERAL:alert, no distress and comfortable SKIN: skin color, texture, turgor are normal, no rashes or significant lesions EYES: normal, conjunctiva are pink and non-injected, sclera clear OROPHARYNX:no exudate, no erythema and lips, buccal mucosa, and tongue normal  NECK: supple, thyroid normal size, non-tender, without nodularity LYMPH:  no palpable lymphadenopathy in the cervical, axillary or inguinal LUNGS: clear to auscultation and percussion with normal breathing effort HEART: regular rate & rhythm and no murmurs and no lower extremity edema ABDOMEN:abdomen soft, non-tender and normal bowel sounds. No palpable splenomegaly Musculoskeletal:no cyanosis of digits  and no clubbing  PSYCH: alert & oriented x 3 with fluent speech NEURO: no focal motor/sensory deficits  LABORATORY DATA:  I have reviewed the data as listed Lab Results  Component Value Date   WBC 9.6 04/07/2013   HGB 17.6* 04/07/2013   HCT 51.5* 04/07/2013   MCV 90.6 04/07/2013   PLT 217 04/07/2013   Lab Results  Component Value Date   NA 135 03/23/2013   K 4.4 03/23/2013   CL 97 03/23/2013   CO2 27 03/23/2013   ASSESSMENT & PLAN:  #1 erythrocytosis Cause is unknown. I suspect he could have myeloproliferative disorder. I ordered a stat CBC my office and his hemoglobin came back 17.6. I will hold off phlebotomy for now. I told him to take aspirin 81 mg daily to prevent risk of blood clot. I will order an additional workup to rule out myeloproliferative disorder. We'll see him back in 2 weeks for further assessment.  Orders Placed This Encounter  Procedures  . Comprehensive metabolic panel    Standing Status: Future     Number of Occurrences: 1     Standing Expiration Date: 04/07/2014  . Bilirubin, direct    Standing Status: Future     Number of Occurrences: 1     Standing Expiration Date: 04/07/2014  . Erythropoietin    Standing Status: Future     Number of Occurrences: 1     Standing Expiration Date: 04/07/2014  . JAK2 genotypr    Standing Status: Future     Number of Occurrences: 1     Standing Expiration Date: 04/07/2014  . Uric Acid    Standing Status: Future     Number of Occurrences: 1     Standing Expiration Date: 04/07/2014  . Lactate dehydrogenase    Standing Status: Future     Number of Occurrences: 1     Standing Expiration Date: 04/07/2014  . CBC with Differential    Standing Status: Standing     Number of Occurrences: 2     Standing Expiration Date: 04/08/2014    All questions were answered. The patient knows to call the clinic with any problems, questions or concerns. I spent 40 minutes counseling the patient face to face. The total time spent in the  appointment was 55 minutes and more than 50% was on counseling.     Twania Bujak, MD 04/07/2013 3:50 PM

## 2013-04-07 NOTE — Telephone Encounter (Signed)
Called Kenneth Wilkerson and left VM asking if [facility] before 4 pm for more lab work.  The lab only drew a CBC although there were more labs Dr. Bertis RuddyGorsuch ordered.  Asked him to call back to Dr. Maxine GlennGorsuch's nurse if he cannot make it back.   (After this call,  Dr. Bertis RuddyGorsuch informs that she was able to get the Jak 2 added on to blood drawn today and Kenneth Wilkerson can have the rest of his labs done on his next visit.)

## 2013-04-19 ENCOUNTER — Other Ambulatory Visit: Payer: Self-pay | Admitting: Hematology and Oncology

## 2013-04-19 DIAGNOSIS — R17 Unspecified jaundice: Secondary | ICD-10-CM

## 2013-04-19 DIAGNOSIS — D751 Secondary polycythemia: Secondary | ICD-10-CM

## 2013-04-20 ENCOUNTER — Encounter: Payer: Self-pay | Admitting: Hematology and Oncology

## 2013-04-20 ENCOUNTER — Ambulatory Visit (HOSPITAL_BASED_OUTPATIENT_CLINIC_OR_DEPARTMENT_OTHER): Payer: 59 | Admitting: Hematology and Oncology

## 2013-04-20 ENCOUNTER — Telehealth: Payer: Self-pay | Admitting: Hematology and Oncology

## 2013-04-20 ENCOUNTER — Other Ambulatory Visit (HOSPITAL_BASED_OUTPATIENT_CLINIC_OR_DEPARTMENT_OTHER): Payer: 59

## 2013-04-20 VITALS — BP 125/82 | HR 79 | Temp 98.3°F | Resp 20 | Ht 69.0 in | Wt 168.2 lb

## 2013-04-20 DIAGNOSIS — R17 Unspecified jaundice: Secondary | ICD-10-CM

## 2013-04-20 DIAGNOSIS — D751 Secondary polycythemia: Secondary | ICD-10-CM

## 2013-04-20 LAB — CBC WITH DIFFERENTIAL/PLATELET
BASO%: 0.3 % (ref 0.0–2.0)
BASOS ABS: 0 10*3/uL (ref 0.0–0.1)
EOS ABS: 0.1 10*3/uL (ref 0.0–0.5)
EOS%: 1.2 % (ref 0.0–7.0)
HCT: 52.5 % — ABNORMAL HIGH (ref 38.4–49.9)
HEMOGLOBIN: 17.7 g/dL — AB (ref 13.0–17.1)
LYMPH%: 34.7 % (ref 14.0–49.0)
MCH: 30.7 pg (ref 27.2–33.4)
MCHC: 33.7 g/dL (ref 32.0–36.0)
MCV: 91 fL (ref 79.3–98.0)
MONO#: 0.7 10*3/uL (ref 0.1–0.9)
MONO%: 9.6 % (ref 0.0–14.0)
NEUT#: 4.1 10*3/uL (ref 1.5–6.5)
NEUT%: 54.2 % (ref 39.0–75.0)
PLATELETS: 269 10*3/uL (ref 140–400)
RBC: 5.77 10*6/uL (ref 4.20–5.82)
RDW: 13.4 % (ref 11.0–14.6)
WBC: 7.5 10*3/uL (ref 4.0–10.3)
lymph#: 2.6 10*3/uL (ref 0.9–3.3)

## 2013-04-20 LAB — COMPREHENSIVE METABOLIC PANEL (CC13)
ALBUMIN: 4.4 g/dL (ref 3.5–5.0)
ALK PHOS: 79 U/L (ref 40–150)
ALT: 38 U/L (ref 0–55)
AST: 34 U/L (ref 5–34)
Anion Gap: 10 mEq/L (ref 3–11)
BUN: 18.4 mg/dL (ref 7.0–26.0)
CO2: 26 mEq/L (ref 22–29)
Calcium: 9.6 mg/dL (ref 8.4–10.4)
Chloride: 102 mEq/L (ref 98–109)
Creatinine: 0.8 mg/dL (ref 0.7–1.3)
GLUCOSE: 70 mg/dL (ref 70–140)
POTASSIUM: 4 meq/L (ref 3.5–5.1)
SODIUM: 138 meq/L (ref 136–145)
TOTAL PROTEIN: 8.4 g/dL — AB (ref 6.4–8.3)
Total Bilirubin: 1.48 mg/dL — ABNORMAL HIGH (ref 0.20–1.20)

## 2013-04-20 LAB — LACTATE DEHYDROGENASE (CC13): LDH: 160 U/L (ref 125–245)

## 2013-04-20 LAB — URIC ACID (CC13): Uric Acid, Serum: 6.3 mg/dl (ref 2.6–7.4)

## 2013-04-20 LAB — BILIRUBIN, DIRECT: Bilirubin, Direct: 0.2 mg/dL (ref 0.0–0.3)

## 2013-04-20 NOTE — Progress Notes (Signed)
[facility] OFFICE PROGRESS NOTE  [name],CHELLE, PA-C DIAGNOSIS:  Erythrocytosis, likely related to undiagnosed obstructive sleep apnea  SUMMARY OF HEMATOLOGIC HISTORY: This patient was referred here because of abnormal CBC. He has chronic elevated hemoglobin from 16.1-18.7. Peripheral blood test for JAK 2 mutation was negative. His girlfriend noted the patient snores excessively at night and the patient has significant nasal congestion all the time. INTERVAL HISTORY: Discussion is done via an interpreter Kenneth Wilkerson 53 y.o. male returns for further followup. He was started on aspirin. He denies any recent bleeding or bruising. I have reviewed the past medical history, past surgical history, social history and family history with the patient and they are unchanged from previous note.  ALLERGIES:  is allergic to amoxicillin; darvocet; and sulfa antibiotics.  MEDICATIONS:  Current Outpatient Prescriptions  Medication Sig Dispense Refill  . aspirin 81 MG tablet Take 81 mg by mouth daily.      Marland Kitchen ibuprofen (ADVIL,MOTRIN) 200 MG tablet Take 200 mg by mouth every 6 (six) hours as needed.      Marland Kitchen lisinopril (PRINIVIL,ZESTRIL) 20 MG tablet Take 1 tablet (20 mg total) by mouth daily.  90 tablet  3   No current facility-administered medications for this visit.     REVIEW OF SYSTEMS:   All other systems were reviewed with the patient and are negative.  PHYSICAL EXAMINATION: ECOG PERFORMANCE STATUS: 0 - Asymptomatic  Filed Vitals:   04/20/13 1046  BP: 125/82  Pulse: 79  Temp: 98.3 F (36.8 C)  Resp: 20   Filed Weights   04/20/13 1046  Weight: 168 lb 3.2 oz (76.295 kg)    GENERAL:alert, no distress and comfortable SKIN: skin color, texture, turgor are normal, no rashes or significant lesions Musculoskeletal:no cyanosis of digits and no clubbing  NEURO: alert & oriented x 3 with fluent speech, no focal motor/sensory deficits  LABORATORY DATA:  I have  reviewed the data as listed Results for orders placed in visit on 04/20/13 (from the past 48 hour(s))  CBC WITH DIFFERENTIAL     Status: Abnormal   Collection Time    04/20/13 10:30 AM      Result Value Ref Range   WBC 7.5  4.0 - 10.3 10e3/uL   NEUT# 4.1  1.5 - 6.5 10e3/uL   HGB 17.7 (*) 13.0 - 17.1 g/dL   HCT 16.1 (*) 09.6 - 04.5 %   Platelets 269  140 - 400 10e3/uL   MCV 91.0  79.3 - 98.0 fL   MCH 30.7  27.2 - 33.4 pg   MCHC 33.7  32.0 - 36.0 g/dL   RBC 4.09  8.11 - 9.14 10e6/uL   RDW 13.4  11.0 - 14.6 %   lymph# 2.6  0.9 - 3.3 10e3/uL   MONO# 0.7  0.1 - 0.9 10e3/uL   Eosinophils Absolute 0.1  0.0 - 0.5 10e3/uL   Basophils Absolute 0.0  0.0 - 0.1 10e3/uL   NEUT% 54.2  39.0 - 75.0 %   LYMPH% 34.7  14.0 - 49.0 %   MONO% 9.6  0.0 - 14.0 %   EOS% 1.2  0.0 - 7.0 %   BASO% 0.3  0.0 - 2.0 %    Lab Results  Component Value Date   WBC 7.5 04/20/2013   HGB 17.7* 04/20/2013   HCT 52.5* 04/20/2013   MCV 91.0 04/20/2013   PLT 269 04/20/2013   ASSESSMENT & PLAN:  #1 secondary erythrocytosis #2 Probable undiagnosed obstructive sleep apnea Peripheral blood  for JAK 2 mutation was negative. I suspect the cause of his erythrocytosis is secondary to undiagnosed obstructive sleep apnea. I recommend evaluation by a pulmonologist. There is no benefit of doing phlebotomy in this situation. Erythrocytosis is his compensatory mechanism. I recommend him to take aspirin to prevent risk of blood clot. I have not make a return appointment for the patient to come back. All questions were answered. The patient knows to call the clinic with any problems, questions or concerns. No barriers to learning was detected.  I spent 15 minutes counseling the patient face to face. The total time spent in the appointment was 20 minutes and more than 50% was on counseling.     Briston Lax, MD 04/20/2013 11:07 AM

## 2013-04-20 NOTE — Telephone Encounter (Signed)
gv adn printed appt sched and avs for pt April 20th @ 3pm with Dr. Craige CottaSood

## 2013-04-22 LAB — ERYTHROPOIETIN: Erythropoietin: 6.3 m[IU]/mL (ref 2.6–18.5)

## 2013-05-11 ENCOUNTER — Institutional Professional Consult (permissible substitution): Payer: 59 | Admitting: Pulmonary Disease

## 2013-05-19 MED ORDER — PROPOFOL 10 MG/ML IV BOLUS
INTRAVENOUS | Status: AC
  Filled 2013-05-19: qty 20

## 2013-05-19 MED ORDER — FENTANYL CITRATE 0.05 MG/ML IJ SOLN
INTRAMUSCULAR | Status: AC
  Filled 2013-05-19: qty 2

## 2013-05-19 MED ORDER — MIDAZOLAM HCL 2 MG/2ML IJ SOLN
INTRAMUSCULAR | Status: AC
  Filled 2013-05-19: qty 2

## 2013-05-28 ENCOUNTER — Institutional Professional Consult (permissible substitution): Payer: 59 | Admitting: Pulmonary Disease

## 2013-06-29 ENCOUNTER — Institutional Professional Consult (permissible substitution): Payer: 59 | Admitting: Pulmonary Disease

## 2013-08-10 ENCOUNTER — Ambulatory Visit (INDEPENDENT_AMBULATORY_CARE_PROVIDER_SITE_OTHER): Payer: 59 | Admitting: Internal Medicine

## 2013-08-10 VITALS — BP 118/74 | HR 71 | Temp 98.9°F | Resp 18 | Ht 68.5 in | Wt 163.8 lb

## 2013-08-10 DIAGNOSIS — K4090 Unilateral inguinal hernia, without obstruction or gangrene, not specified as recurrent: Secondary | ICD-10-CM

## 2013-08-10 NOTE — Patient Instructions (Signed)
Our office will call you about an appointment with a surgeon  Hernia (Hernia) Una hernia ocurre cuando un rgano interno protruye a travs de un punto dbil de los msculos de la pared muscular abdominal (del vientre). Se producen con mayor frecuencia en la ingle y alrededor del ombligo. Generalmente puede volver a Museum/gallery exhibitions officercolocarse en su lugar (reducirse). La mayor parte de las hernias tienden a Theme park managerempeorar con Museum/gallery conservatorel tiempo. Algunas hernias abdominales pueden atascarse en la abertura (hernias irreductibles o hernia encarcelada) y no pueden reducirse. Una hernia abdominal irreducible que est ligeramente apretada en la abertura, corre el riesgo de daar el flujo de sangre (hernia estrangulada). Una hernia estrangulada es una emergencia mdica. Debido al riesgo que se corre en caso de hernia irreducible o estrangulada, se recomienda la ciruga para repararla. CAUSAS  Levantar peso excesivo.  Mucha tos.  Tensin al ir de cuerpo.  Durante la ciruga abdominal se realiza un corte (incisin). INSTRUCCIONES PARA EL CUIDADO DOMICILIARIO  No es necesario hacer reposo en cama. Puede continuar con sus actividades habituales.  Evite levantar peso (ms de 10 libras o 4,5 Kg) o hacer esfuerzos.  Tos con suavidad. Si actualmente usted fuma, es el momento de abandonar el hbito. Hasta el procedimiento quirrgico ms perfecto puede malograrse si se hace fuerza contnua para toser. Aunque su hernia no haya sido reparada, la tos puede agravar el problema.  No use nada apretado sobre la hernia. No trate de mantenerla adentro con un vendaje externo o un braguero. Puede lesionar el contenido abdominal si los aprieta dentro del saco de la hernia.  Consumir una dieta normal.  Evite la constipacin. Si hace mucha fuerza aumentar el tamao de la hernia y podr daarse la reparacin. Si no lo logra slo con la dieta, puede usar laxantes. SOLICITE ATENCIN MDICA DE INMEDIATO SI:  Tiene fiebre.  Presenta un dolor abdominal  cada vez ms intenso.  Si tiene Programme researcher, broadcasting/film/videomalestar estomacal (nuseas) y vmitos.  La hernia se ha atascado fuera del abdomen, se ve descolorida, se siente dura o le duele.  Observa cambios en el hbito intestinal o en la hernia, lo que no es habitual en usted.  El dolor o la hinchazn alrededor de la hernia [place]umentan.  No puede volver a Electrical engineercolocar la hernia en su lugar ejerciendo una presin suave mientras se encuentra recostado. EST SEGURO QUE:   Comprende las instrucciones para el alta mdica.  Controlar su enfermedad.  Solicitar atencin mdica de inmediato segn las indicaciones. Document Released: 01/08/2005 Document Revised: 04/02/2011 [facility] Patient Information 2015 [place], [place]. This information is not intended to replace advice given to you by your health care provider. Make sure you discuss any questions you have with your health care provider.

## 2013-08-10 NOTE — Progress Notes (Signed)
   Subjective:    Patient ID: Kenneth SorrelSebastian R Petrizzo, male    DOB: 12/18/1960, 53 y.o.   MRN: [id number]  HPI This is a very pleasant 53 yo male who presents today with 3 day history of groin pain. The patient was collecting some recycling materials and lifted 2 frigerators 4 days ago. He had some pain in his groin and the next day he noticed a bulging in his left groin. It is only painful if he touches it. He notices it when he stands only.  Past Medical History  Diagnosis Date  . Hypertension   . Hemorrhoids   . Vitamin D deficiency   . Hyperlipidemia   . Tuberculosis     Treated for in 2000  . Erythrocytosis 04/07/2013  . Elevated bilirubin 04/07/2013   Past Surgical History  Procedure Laterality Date  . Hemorrhoid surgery      Review of Systems Normal urination and defecation, pain with cough, no fever or chills, no nausea/vomiting    Objective:   Physical Exam  Vitals reviewed. Constitutional: He is oriented to person, place, and time. He appears well-developed and well-nourished.  HENT:  Head: Normocephalic and atraumatic.  Eyes: Conjunctivae are normal.  Neck: Normal range of motion. Neck supple.  Cardiovascular: Normal rate.   Pulmonary/Chest: Effort normal.  Abdominal: Soft. A hernia is present. Hernia confirmed positive in the left inguinal area. Hernia confirmed negative in the right inguinal area.  Genitourinary: Testes normal and penis normal. Right testis shows no mass, no swelling and no tenderness. Right testis is descended. Left testis shows no mass, no swelling and no tenderness. Left testis is descended. Circumcised.  Slight fullness in left groin, more pronounced with standing. Soft. No pain changing position, slightly tender with palpation.   Musculoskeletal: Normal range of motion.  Neurological: He is alert and oriented to person, place, and time.  Skin: Skin is warm and dry.  Psychiatric: He has a normal mood and affect. His behavior is normal. Judgment  and thought content normal.      Assessment & Plan:  Discussed with Dr. Perrin MalteseGuest who also examined the patient 1. Inguinal hernia, left - Ambulatory referral to General Surgery -provided written and verbal information about his diagnosis.  -Patient to RTC if any increased swelling or pain prior to his surgery appointment  Emi Belfasteborah B. Gessner, FNP-BC  [facility], [facility]  08/10/2013 9:40 AM

## 2013-08-28 ENCOUNTER — Ambulatory Visit (INDEPENDENT_AMBULATORY_CARE_PROVIDER_SITE_OTHER): Payer: 59 | Admitting: General Surgery

## 2013-08-28 ENCOUNTER — Encounter (INDEPENDENT_AMBULATORY_CARE_PROVIDER_SITE_OTHER): Payer: Self-pay | Admitting: General Surgery

## 2013-08-28 VITALS — BP 118/84 | HR 66 | Temp 97.7°F | Resp 16 | Ht 68.0 in | Wt 167.2 lb

## 2013-08-28 DIAGNOSIS — K4090 Unilateral inguinal hernia, without obstruction or gangrene, not specified as recurrent: Secondary | ICD-10-CM

## 2013-08-28 DIAGNOSIS — L723 Sebaceous cyst: Secondary | ICD-10-CM

## 2013-08-28 NOTE — Patient Instructions (Signed)
Quiste epidrmico  (Epidermal Cyst) Un quiste epidrmico se denomina tambin quiste sebceo, quiste de inclusin epidrmica o quiste infundibular. Estos quistes contienen una sustancia "pastosa" o similar al "queso" y puede tener mal olor. Esta sustancia es una protena denominada [place]. Estos quistes generalmente se forman en el rostro, el cuello o el tronco. Tambin pueden aparecer en la zona vaginal u otras partes de los genitales, tanto en hombres como en mujeres. En general son pequeos bultos indoloros, que crecen lentamente y que se mueven libremente debajo de la piel. Es importante no tratar de apretarlos para extraer la sustancia que contienen. Esto puede ocasionar una infeccin que origine dolor e hinchazn en el rea.  CAUSAS  La causa del puede ser una lesin penetrante profunda o un folculo piloso obstruido, generalmente asociado al acn.  SNTOMAS  Los quistes epidermicos pueden inflamarse y causar:   Enrojecimiento.  Sensibilidad.  Aumento de la temperatura en la zona.  Material que drena de color blanco grisceo, consistente y de [employer]. DIAGNSTICO Generalmente estas infecciones son diagnosticadas por Medical illustrator examen fsico. En raras ocasiones ser necesario realizar una biopsia para descartar otros trastornos que parezcan ser similares.  TRATAMIENTO  Generalmente mejoran y desaparecen sin tratamiento. No suelen ser peligrosos.  Pueden inflamarse y sensibilizarse si se infectan. Esto puede requerir Warden/ranger y drenaje del quiste. Podr ser necesaria la administracin de antibiticos. Cuando la infeccin haya desaparecido, el quiste podr eliminarse con Futures trader.  Los pequeos quistes inflamados generalmente pueden tratarse inyectado corticoides con los antibiticos.  En algunos casos el quiste se [place] y puede ser [place] preocupacin. Si esto ocurre, es necesario extirparlo quirrgicamente en el consultorio del  profesional. INSTRUCCIONES PARA EL CUIDADO EN EL HOGAR   Tome slo medicamentos de venta libre o recetados, segn las indicaciones del mdico.  Tome los antibiticos como se le indic. Tmelos todos, aunque se sienta mejor. SOLICITE ATENCIN MDICA SI:   Siente dolor, observa enrojecimiento o hinchazn.  El problema no mejora, o empeora.  Tiene preguntas o preocupaciones. ASEGRESE DE QUE:   Comprende estas instrucciones.  Controlar su enfermedad.  Solicitar ayuda de inmediato si no mejora o si empeora. Document Released: 02/19/2006 Document Revised: 04/02/2011 [facility] Patient Information 2015 [place], [place]. This information is not intended to replace advice given to you by your health care provider. Make sure you discuss any questions you have with your health care provider. Hernia (Hernia) Una hernia ocurre cuando un rgano interno protruye a travs de un punto dbil de los msculos de la pared muscular abdominal (del vientre). Se producen con mayor frecuencia en la ingle y alrededor del ombligo. Generalmente puede volver a Museum/gallery exhibitions officer (reducirse). La mayor parte de las hernias tienden a Theme park manager con Museum/gallery conservator. Algunas hernias abdominales pueden atascarse en la abertura (hernias irreductibles o hernia encarcelada) y no pueden reducirse. Una hernia abdominal irreducible que est ligeramente apretada en la abertura, corre el riesgo de daar el flujo de sangre (hernia estrangulada). Una hernia estrangulada es una emergencia mdica. Debido al riesgo que se corre en caso de hernia irreducible o estrangulada, se recomienda la ciruga para repararla. CAUSAS  Levantar peso excesivo.  Mucha tos.  Tensin al ir de cuerpo.  Durante la ciruga abdominal se realiza un corte (incisin). INSTRUCCIONES PARA EL CUIDADO DOMICILIARIO  No es necesario hacer reposo en cama. Puede continuar con sus actividades habituales.  Evite levantar peso (ms de 10 libras o 4,5 Kg) o hacer  esfuerzos.  Leonette Most  con suavidad. Si actualmente usted fuma, es el momento de abandonar el hbito. Hasta el procedimiento quirrgico ms perfecto puede malograrse si se hace fuerza contnua para toser. Aunque su hernia no haya sido reparada, la tos puede agravar el problema.  No use nada apretado sobre la hernia. No trate de mantenerla adentro con un vendaje externo o un braguero. Puede lesionar el contenido abdominal si los aprieta dentro del saco de la hernia.  Consumir una dieta normal.  Evite la constipacin. Si hace mucha fuerza aumentar el tamao de la hernia y podr daarse la reparacin. Si no lo logra slo con la dieta, puede usar laxantes. SOLICITE ATENCIN MDICA DE INMEDIATO SI:  Tiene fiebre.  Presenta un dolor abdominal cada vez ms intenso.  Si tiene Programme researcher, broadcasting/film/videomalestar estomacal (nuseas) y vmitos.  La hernia se ha atascado fuera del abdomen, se ve descolorida, se siente dura o le duele.  Observa cambios en el hbito intestinal o en la hernia, lo que no es habitual en usted.  El dolor o la hinchazn alrededor de la hernia [place]an.  No puede volver a Electrical engineercolocar la hernia en su lugar ejerciendo una presin suave mientras se encuentra recostado. EST SEGURO QUE:   Comprende las instrucciones para el alta mdica.  Controlar su enfermedad.  Solicitar atencin mdica de inmediato segn las indicaciones. Document Released: 01/08/2005 Document Revised: 04/02/2011 [facility] Patient Information 2015 [place], [place]. This information is not intended to replace advice given to you by your health care provider. Make sure you discuss any questions you have with your health care provider.

## 2013-08-28 NOTE — Progress Notes (Signed)
Subjective:   Hernia left groin and cyst on scrotum  Patient ID: Kenneth Wilkerson, male   DOB: 03-02-1960, 53 y.o.   MRN: [id number]  HPI Patient is a very pleasant 53 year old male referred by Dr. Perrin Maltese. The patient was doing some heavy lifting about a month ago and developed pain across his lower abdomen/suprapubic area. This got better after a couple of days but he soon after noticed a lump in his left groin. This has persisted and in only mildly sore. He has no symptoms on the right. No previous history of hernia repairs. No GI disturbance. Colonoscopy is up-to-date. He has noticed a lump on the scrotum for several months that has gotten larger.  Past Medical History  Diagnosis Date  . Hypertension   . Hemorrhoids   . Vitamin D deficiency   . Hyperlipidemia   . Tuberculosis     Treated for in 2000  . Erythrocytosis 04/07/2013  . Elevated bilirubin 04/07/2013   Past Surgical History  Procedure Laterality Date  . Hemorrhoid surgery     Current Outpatient Prescriptions  Medication Sig Dispense Refill  . aspirin 81 MG tablet Take 81 mg by mouth daily.      Marland Kitchen ibuprofen (ADVIL,MOTRIN) 200 MG tablet Take 200 mg by mouth every 6 (six) hours as needed.      Marland Kitchen lisinopril (PRINIVIL,ZESTRIL) 20 MG tablet Take 1 tablet (20 mg total) by mouth daily.  90 tablet  3   No current facility-administered medications for this visit.   Allergies  Allergen Reactions  . Amoxicillin Other (See Comments)    Shaking/ tremors  . Darvocet [Propoxyphene N-Acetaminophen] Other (See Comments)    HA, NERVOUSNESS  . Sulfa Antibiotics Other (See Comments)    Dizziness, shakey, sweats   History  Substance Use Topics  . Smoking status: Never Smoker   . Smokeless tobacco: Never Used  . Alcohol Use: 0.0 - 1.0 oz/week    0-2 drink(s) per week     Past Medical History  Diagnosis Date  . Hypertension   . Hemorrhoids   . Vitamin D deficiency   . Hyperlipidemia   . Tuberculosis     Treated for in 2000   . Erythrocytosis 04/07/2013  . Elevated bilirubin 04/07/2013   Past Surgical History  Procedure Laterality Date  . Hemorrhoid surgery     .cmede Allergies  Allergen Reactions  . Amoxicillin Other (See Comments)    Shaking/ tremors  . Darvocet [Propoxyphene N-Acetaminophen] Other (See Comments)    HA, NERVOUSNESS  . Sulfa Antibiotics Other (See Comments)    Dizziness, shakey, sweats     Review of Systems  Respiratory: Negative.   Cardiovascular: Negative.   Gastrointestinal: Negative.   Genitourinary: Negative.        Objective:   Physical Exam BP 118/84  Pulse 66  Temp(Src) 97.7 F (36.5 C) (Temporal)  Resp 16  Ht 5\' 8"  (1.727 m)  Wt 167 lb 3.2 oz (75.841 kg)  BMI 25.43 kg/m2 General: Alert, well-developed Hispanic male, in no distress Skin: Warm and dry without rash or infection. HEENT: No palpable masses or thyromegaly. Sclera nonicteric. Pupils equal round and reactive. Oropharynx clear. Lymph nodes: No cervical, supraclavicular, or inguinal nodes palpable. Lungs: Breath sounds clear and equal without increased work of breathing Cardiovascular: Regular rate and rhythm without murmur. No JVD or edema. Peripheral pulses intact. Abdomen: Nondistended. Soft and nontender. No masses palpable. No organomegaly. There is a moderate-sized slightly tender reducible left inguinal hernia. No hernia palpable on  the right. GU: In the central scrotum is a 1.5 cm superficial mildly inflamed epidermoid cyst Extremities: No edema or joint swelling or deformity. No chronic venous stasis changes. Neurologic: Alert and fully oriented. Gait normal.    Assessment:     #1 symptomatic left inguinal hernia #2 epidermoid cyst of the scrotum     Plan:     We discussed the nature of the problem today. We discussed surgical options including open and laparoscopic repair. His hernia should be repaired as it is symptomatic. I believe he would be a good candidate for laparoscopic repair which  is what he would prefer. He was already somewhat familiar with the procedure from a friend of his who had a period we discussed the nature of the procedure and recovery as well as risks of anesthetic complications, bleeding, infection, recurrence and chronic pain. We will plan to remove the enlarging sebaceous cyst from the scrotum at the same time. He was given literature regarding the procedure and all his questions were answered.

## 2013-11-03 NOTE — Progress Notes (Signed)
FYI Patient surgery cancelled due to chest pains and concerns with lungs.  Patient scheduled to see Pulomonoligist which patient has no showed or cancelled the past 4 appointments.

## 2013-12-05 ENCOUNTER — Telehealth: Payer: Self-pay

## 2013-12-05 DIAGNOSIS — R17 Unspecified jaundice: Secondary | ICD-10-CM

## 2013-12-05 DIAGNOSIS — D751 Secondary polycythemia: Secondary | ICD-10-CM

## 2013-12-05 MED ORDER — IBUPROFEN 800 MG PO TABS: 800.0000 mg | ORAL_TABLET | Freq: Four times a day (QID) | ORAL | Status: DC | PRN

## 2013-12-05 NOTE — Telephone Encounter (Signed)
Pt would like for us to refill his ibuprofen 800mg  for his back pain.  He has been taken OTC ibuprofen and its not helping but insists that the rx ibuprofen works better even though its the same.

## 2013-12-05 NOTE — Telephone Encounter (Signed)
Meds ordered this encounter  Medications  . ibuprofen (ADVIL,MOTRIN) 800 MG tablet    Sig: Take 1 tablet (800 mg total) by mouth every 6 (six) hours as needed.    Dispense:  90 tablet    Refill:  1    Order Specific Question:  Supervising Provider    Answer:  DOOLITTLE, ROBERT P [[id number]]

## 2013-12-05 NOTE — Telephone Encounter (Signed)
Pt states he went to [facility] to pick up his rx's for lisinopril and 800 mg ibuprofen. States pharmacy told him they dont have rx for 800mg  ibuprofen and pt needs to speak with pcp.  No 800 mg ibuprofen in medication list.  Pt states regular dosing on otc ibuprofen not helping.   Please call pt.  [facility] [place]  bf

## 2013-12-06 NOTE — Telephone Encounter (Signed)
Pt notified that rx was sent

## 2014-06-23 ENCOUNTER — Other Ambulatory Visit: Payer: Self-pay | Admitting: Emergency Medicine

## 2014-08-09 ENCOUNTER — Ambulatory Visit (INDEPENDENT_AMBULATORY_CARE_PROVIDER_SITE_OTHER): Payer: 59 | Admitting: Physician Assistant

## 2014-08-09 VITALS — BP 128/82 | HR 89 | Temp 98.4°F | Resp 17 | Ht 68.0 in | Wt 169.0 lb

## 2014-08-09 DIAGNOSIS — I10 Essential (primary) hypertension: Secondary | ICD-10-CM | POA: Insufficient documentation

## 2014-08-09 DIAGNOSIS — M545 Low back pain: Secondary | ICD-10-CM

## 2014-08-09 MED ORDER — LISINOPRIL 20 MG PO TABS: 20.0000 mg | ORAL_TABLET | Freq: Every day | ORAL | Status: DC

## 2014-08-09 MED ORDER — IBUPROFEN 800 MG PO TABS: 800.0000 mg | ORAL_TABLET | Freq: Four times a day (QID) | ORAL | Status: DC | PRN

## 2014-08-09 NOTE — Progress Notes (Signed)
   Subjective:    Patient ID: Kenneth Wilkerson, male    DOB: 24-Jul-1960, 54 y.o.   MRN: [id number]  HPI Patient presents for refill of lisinopril and ibuprofen. Has been compliant with lisinopril and has not missed any doses. Checks BP at home sometimes, but does not recall any of the numbers. Denies SOB, CP, edema, palpitations, or Ha/dizziness. Has not made any changes to diet and says diet includes a lot of chicken, beans, and rice. Denies exercise. Does not smoke tobacco.  Uses ibuprofen 3-4x/week for lower back and neck pain. Mostly uses following strenuous day of work. Works for a [employer] and has a lot stooping/bending repetitive movements.    Review of Systems As noted above.    Objective:   Physical Exam  Constitutional: He is oriented to person, place, and time. He appears well-developed and well-nourished. No distress.  Blood pressure 128/82, pulse 89, temperature 98.4 F (36.9 C), temperature source Oral, resp. rate 17, height 5\' 8"  (1.727 m), weight 169 lb (76.658 kg), SpO2 98 %.   HENT:  Head: Normocephalic and atraumatic.  Right Ear: External ear normal.  Left Ear: External ear normal.  Mouth/Throat: Oropharynx is clear and moist.  Eyes: Conjunctivae are normal. Pupils are equal, round, and reactive to light. Right eye exhibits no discharge. Left eye exhibits no discharge. No scleral icterus.  Neck: Normal range of motion. Neck supple. No JVD present. Carotid bruit is not present.  Cardiovascular: Normal rate, regular rhythm, normal heart sounds and intact distal pulses.  Exam reveals no gallop and no friction rub.   No murmur heard. Pulmonary/Chest: Effort normal and breath sounds normal. No respiratory distress. He has no decreased breath sounds. He has no wheezes. He has no rhonchi. He has no rales.  Musculoskeletal: He exhibits no edema.  Lymphadenopathy:    He has no cervical adenopathy.  Neurological: He is alert and oriented to person, place, and  time. He has normal reflexes.  Skin: Skin is warm and dry. No rash noted. He is not diaphoretic. No erythema. No pallor.  Psychiatric: He has a normal mood and affect. His behavior is normal. Judgment and thought content normal.       Assessment & Plan:  1. Essential hypertension AHA exercise guidelines given and discussed.  - lisinopril (PRINIVIL,ZESTRIL) 20 MG tablet; Take 1 tablet (20 mg total) by mouth daily. Dispense: 30 tablet; Refill: 3  2. Bilateral low back pain, with sciatica presence unspecified - ibuprofen (ADVIL,MOTRIN) 800 MG tablet; Take 1 tablet (800 mg total) by mouth every 6 (six) hours as needed.  Dispense: 90 tablet; Refill: 1   Ahleah Simko PA-C  [facility] 08/09/2014 10:23 AM

## 2014-08-09 NOTE — Patient Instructions (Signed)

## 2014-09-21 ENCOUNTER — Encounter: Payer: Self-pay | Admitting: Cardiovascular Disease

## 2015-08-16 ENCOUNTER — Ambulatory Visit (INDEPENDENT_AMBULATORY_CARE_PROVIDER_SITE_OTHER): Payer: BLUE CROSS/BLUE SHIELD

## 2015-08-16 ENCOUNTER — Ambulatory Visit (INDEPENDENT_AMBULATORY_CARE_PROVIDER_SITE_OTHER): Payer: BLUE CROSS/BLUE SHIELD | Admitting: Physician Assistant

## 2015-08-16 ENCOUNTER — Encounter: Payer: Self-pay | Admitting: Physician Assistant

## 2015-08-16 VITALS — BP 142/90 | HR 72 | Temp 98.3°F | Resp 16 | Ht 69.0 in | Wt 173.4 lb

## 2015-08-16 DIAGNOSIS — R10819 Abdominal tenderness, unspecified site: Secondary | ICD-10-CM

## 2015-08-16 DIAGNOSIS — I10 Essential (primary) hypertension: Secondary | ICD-10-CM

## 2015-08-16 DIAGNOSIS — K5900 Constipation, unspecified: Secondary | ICD-10-CM

## 2015-08-16 LAB — POCT URINALYSIS DIP (MANUAL ENTRY)
Bilirubin, UA: NEGATIVE
Blood, UA: NEGATIVE
Glucose, UA: NEGATIVE
Ketones, POC UA: NEGATIVE
Leukocytes, UA: NEGATIVE
NITRITE UA: NEGATIVE
PROTEIN UA: NEGATIVE
SPEC GRAV UA: 1.015
Urobilinogen, UA: 0.2
pH, UA: 6.5

## 2015-08-16 LAB — POCT CBC
Granulocyte percent: 58.8 %G (ref 37–80)
HCT, POC: 48.1 % (ref 43.5–53.7)
HEMOGLOBIN: 17.2 g/dL (ref 14.1–18.1)
Lymph, poc: 2.1 (ref 0.6–3.4)
MCH, POC: 32.5 pg — AB (ref 27–31.2)
MCHC: 35.7 g/dL — AB (ref 31.8–35.4)
MCV: 91.1 fL (ref 80–97)
MID (cbc): 0.7 (ref 0–0.9)
MPV: 8.2 fL (ref 0–99.8)
POC GRANULOCYTE: 4 (ref 2–6.9)
POC LYMPH PERCENT: 30.9 %L (ref 10–50)
POC MID %: 10.3 % (ref 0–12)
Platelet Count, POC: 193 10*3/uL (ref 142–424)
RBC: 5.29 M/uL (ref 4.69–6.13)
RDW, POC: 13.6 %
WBC: 6.8 10*3/uL (ref 4.6–10.2)

## 2015-08-16 LAB — COMPLETE METABOLIC PANEL WITH GFR
ALT: 24 U/L (ref 9–46)
AST: 23 U/L (ref 10–35)
Albumin: 4.4 g/dL (ref 3.6–5.1)
Alkaline Phosphatase: 71 U/L (ref 40–115)
BUN: 16 mg/dL (ref 7–25)
CALCIUM: 9.4 mg/dL (ref 8.6–10.3)
CO2: 27 mmol/L (ref 20–31)
Chloride: 102 mmol/L (ref 98–110)
Creat: 0.73 mg/dL (ref 0.70–1.33)
GFR, Est African American: >89 mL/min (ref >=60)
GFR, Est Non African American: >89 mL/min (ref >=60)
Glucose, Bld: 92 mg/dL (ref 65–99)
Potassium: 4.3 mmol/L (ref 3.5–5.3)
Sodium: 138 mmol/L (ref 135–146)
Total Bilirubin: 1.5 mg/dL — ABNORMAL HIGH (ref 0.2–1.2)
Total Protein: 7.7 g/dL (ref 6.1–8.1)

## 2015-08-16 LAB — CREATININE WITH EST GFR: Creat: 0.73 mg/dL (ref 0.70–1.33)

## 2015-08-16 MED ORDER — LISINOPRIL 20 MG PO TABS: 20.0000 mg | ORAL_TABLET | Freq: Every day | ORAL | 3 refills | Status: DC

## 2015-08-16 MED ORDER — DOCUSATE SODIUM 100 MG PO CAPS: 100.0000 mg | ORAL_CAPSULE | Freq: Two times a day (BID) | ORAL | 0 refills | Status: DC

## 2015-08-16 NOTE — Progress Notes (Signed)
08/16/2015 10:02 AM   DOB: 1960-01-28 / MRN: [id number]  SUBJECTIVE:  Kenneth Wilkerson is a 55 y.o. male presenting for abdominal pain that started two weeks ago.  He reports the pain is suprapubic and he describes the pain sharp and this can last 45 minutes and then resolves.  He can not associate any movements that exacerbate the pain.  He denies dysuria, urgency, frequency, and hematuria.  He denies any changes in diet and defecation habits.  He defecates daily.  He denies fever, chills, nausea.   Depression screen [facility] 2/9 08/16/2015  Decreased Interest 0  Down, Depressed, Hopeless 0  PHQ - 2 Score 0  Altered sleeping 0  Tired, decreased energy 1  Change in appetite 0  Feeling bad or failure about yourself  0  Trouble concentrating 0  Moving slowly or fidgety/restless 0  Suicidal thoughts 0  PHQ-9 Score 1     He is allergic to amoxicillin; darvocet [propoxyphene n-acetaminophen]; and sulfa antibiotics.   He  has a past medical history of Elevated bilirubin (04/07/2013); Erythrocytosis (04/07/2013); Hemorrhoids; Hyperlipidemia; Hypertension; Tuberculosis; and Vitamin D deficiency.    He  reports that he has never smoked. He has never used smokeless tobacco. He reports that he drinks about 1.8 - 3.0 oz of alcohol per week . He reports that he does not use drugs. He  reports that he does not engage in sexual activity. The patient  has a past surgical history that includes Hemorrhoid surgery.  His family history is not on file.  Review of Systems  Constitutional: Negative for chills and fever.  Respiratory: Negative for cough.   Cardiovascular: Negative for chest pain.  Gastrointestinal: Positive for abdominal pain. Negative for blood in stool, constipation, diarrhea, heartburn, melena, nausea and vomiting.  Genitourinary: Negative for dysuria, flank pain, frequency, hematuria and urgency.  Skin: Negative for itching and rash.  Neurological: Negative for dizziness and headaches.   Psychiatric/Behavioral: Negative for depression.    Problem list and medications reviewed and updated by myself where necessary, and exist elsewhere in the encounter.   OBJECTIVE:  BP (!) 142/90 (BP Location: Left Arm, Patient Position: Sitting, Cuff Size: Large)   Pulse 72   Temp 98.3 F (36.8 C) (Oral)   Resp 16   Ht  (1.753 m)   Wt 173 lb 6.4 oz (78.7 kg)   SpO2 99%   BMI 25.61 kg/m   Physical Exam  Constitutional: He is oriented to person, place, and time. He appears well-developed. He does not appear ill.  Eyes: Conjunctivae and EOM are normal. Pupils are equal, round, and reactive to light.  Cardiovascular: Normal rate.   Pulmonary/Chest: Effort normal.  Abdominal: Soft. Bowel sounds are normal. He exhibits no distension and no mass. There is tenderness (suprapubic). There is no rebound and no guarding.  Genitourinary: Prostate normal.    Prostate is not enlarged and not tender.  Musculoskeletal: Normal range of motion.  Neurological: He is alert and oriented to person, place, and time. No cranial nerve deficit. Coordination normal.  Skin: Skin is warm and dry. He is not diaphoretic.  Psychiatric: He has a normal mood and affect.  Nursing note and vitals reviewed.   Results for orders placed or performed in visit on 08/16/15 (from the past 72 hour(s))  POCT urinalysis dipstick     Status: None   Collection Time: 08/16/15  9:21 AM  Result Value Ref Range   Color, UA yellow yellow   Clarity, UA clear  clear   Glucose, UA negative negative   Bilirubin, UA negative negative   Ketones, POC UA negative negative   Spec Grav, UA 1.015    Blood, UA negative negative   pH, UA 6.5    Protein Ur, POC negative negative   Urobilinogen, UA 0.2    Nitrite, UA Negative Negative   Leukocytes, UA Negative Negative    Dg Abd 2 Views  Result Date: 08/16/2015 CLINICAL DATA:  Abdominal pain. EXAM: ABDOMEN - 2 VIEW COMPARISON:  CT 05/05/2009. FINDINGS: Soft tissue  structures are unremarkable. Stool noted throughout the colon. No bowel distention or free air. Degenerative changes lumbar spine with scoliosis concave right. IMPRESSION: No acute or focal abnormality. Stool noted throughout the colon. Constipation cannot be excluded. Electronically Signed   By: Maisie Fus  Register   On: 08/16/2015 09:39  Lab Results  Component Value Date   TSH 1.282 03/04/2012     ASSESSMENT AND PLAN  [name] was seen today for abdominal pain.  Diagnoses and all orders for this visit:  Suprapubic tenderness: Well appearing male here today with complaints of transient suprapubic tenderness.  Labs reassuring so far and rads showing likely constipation.  Will pursue a bowel prep and hold on empiric antibiotics until PSA is back, however his prostate is non tender today.  -     POCT urinalysis dipstick -     PSA -     Creatinine with Est GFR -     COMPLETE METABOLIC PANEL WITH GFR -     DG Abd 2 Views; Future -     POCT CBC  Constipation, unspecified constipation type -     docusate sodium (COLACE) 100 MG capsule; Take 1 capsule (100 mg total) by mouth 2 (two) times daily.  Hypertension, Benign -     He is out of his medication.  Will refill for a year.  We have all the labs we need in the workup of the first problem.     The patient was advised to call or return to clinic if he does not see an improvement in symptoms, or to seek the care of the closest emergency department if he worsens with the above plan.   Deliah Boston, MHS, PA-C [facility] 08/16/2015 10:02 AM

## 2015-08-16 NOTE — Patient Instructions (Addendum)
Please come back in two weeks for an annual physical.  Please call the day before at 4:30 to make an appointment.  If you are not improved with clearing your constipation then please call me in five days so that I may proceed with other treatment options. I am checking your PSA and some other labs.

## 2015-08-17 LAB — PSA: PSA: 0.66 ng/mL (ref <=4.00)

## 2015-09-12 ENCOUNTER — Other Ambulatory Visit: Payer: Self-pay | Admitting: Urgent Care

## 2015-09-12 DIAGNOSIS — I10 Essential (primary) hypertension: Secondary | ICD-10-CM

## 2015-09-12 MED ORDER — LISINOPRIL 20 MG PO TABS: 20.0000 mg | ORAL_TABLET | Freq: Every day | ORAL | 3 refills | Status: DC

## 2016-01-17 ENCOUNTER — Other Ambulatory Visit: Payer: Self-pay

## 2016-01-17 NOTE — Telephone Encounter (Signed)
Patient walked in today to ask if we could refill his Ibuprofen 800mg  prescription or if he needs to be seen again.  Please advise  [phone]

## 2016-01-18 NOTE — Telephone Encounter (Signed)
RTC.  This is a high dose for a chronic therapy. Deliah BostonMichael Kae Lauman, MS, PA-C 4:53 PM, 01/18/2016

## 2016-01-18 NOTE — Telephone Encounter (Signed)
07/2015 last ov and labs 

## 2016-01-24 ENCOUNTER — Ambulatory Visit (INDEPENDENT_AMBULATORY_CARE_PROVIDER_SITE_OTHER): Payer: BLUE CROSS/BLUE SHIELD | Admitting: Physician Assistant

## 2016-01-24 VITALS — BP 126/76 | HR 97 | Temp 97.8°F | Resp 17 | Ht 69.0 in | Wt 181.0 lb

## 2016-01-24 DIAGNOSIS — H60392 Other infective otitis externa, left ear: Secondary | ICD-10-CM | POA: Diagnosis not present

## 2016-01-24 DIAGNOSIS — Z760 Encounter for issue of repeat prescription: Secondary | ICD-10-CM

## 2016-01-24 MED ORDER — IBUPROFEN 800 MG PO TABS: 800.0000 mg | ORAL_TABLET | Freq: Four times a day (QID) | ORAL | 1 refills | Status: DC | PRN

## 2016-01-24 MED ORDER — HYDROCORTISONE-ACETIC ACID 1-2 % OT SOLN: 3.0000 [drp] | Freq: Two times a day (BID) | OTIC | 0 refills | Status: DC

## 2016-01-24 NOTE — Telephone Encounter (Signed)
Pt advised needs ov for refill. tx up front for an appt,

## 2016-01-24 NOTE — Patient Instructions (Addendum)
For everyday aches and pains take tylenol (acetaminophen) 1000 mg every 8 hours.     IF you received an x-ray today, you will receive an invoice from [facility]. Please contact [facility] at [phone] with questions or concerns regarding your invoice.   IF you received labwork today, you will receive an invoice from [facility]. Please contact [facility] at [phone] with questions or concerns regarding your invoice.   Our billing staff will not be able to assist you with questions regarding bills from these companies.  You will be contacted with the lab results as soon as they are available. The fastest way to get your results is to activate your My Chart account. Instructions are located on the last page of this paperwork. If you have not heard from us regarding the results in 2 weeks, please contact this office.

## 2016-01-24 NOTE — Progress Notes (Signed)
  01/24/2016 3:56 PM   DOB: [birth year] / MRN: [id number]  SUBJECTIVE:  Kenneth Wilkerson is a 56 y.o. male presenting for pain in his left ear.  This started 2 days ago. He denies a change in hearing, nasal congestion, fever.   He takes 800 mg of ibuprofen about 1 time per week for a history of back pain.  He would like a refill of this today. He takes roughly 1 tabs weekly for aches and pains.  He denies a history of  He is allergic to amoxicillin; darvocet [propoxyphene n-acetaminophen]; and sulfa antibiotics.     Review of Systems  Constitutional: Negative for chills and fever.  HENT: Positive for ear pain. Negative for ear discharge, hearing loss and sore throat.   Gastrointestinal: Negative for nausea.  Musculoskeletal: Positive for back pain (old problem) and joint pain (old problem).  Skin: Negative for itching and rash.  Neurological: Negative for dizziness.    The problem list and medications were reviewed and updated by myself where necessary and exist elsewhere in the encounter.   OBJECTIVE:  BP 126/76 (BP Location: Right Arm, Patient Position: Sitting, Cuff Size: Small)   Pulse 97   Temp 97.8 F (36.6 C) (Oral)   Resp 17   Ht 5\' 9"  (1.753 m)   Wt 181 lb (82.1 kg)   SpO2 98%   BMI 26.73 kg/m   Physical Exam  Constitutional: He is oriented to person, place, and time. He appears well-developed and well-nourished. No distress.  HENT:  Right Ear: Tympanic membrane normal.  Left Ear: Tympanic membrane normal.  Ears:  Cardiovascular: Normal rate and regular rhythm.   Pulmonary/Chest: Effort normal and breath sounds normal.  Musculoskeletal: Normal range of motion.  Neurological: He is alert and oriented to person, place, and time.  Skin: Skin is warm and dry. He is not diaphoretic.  Psychiatric: He has a normal mood and affect.    No results found for this or any previous visit (from the past 72 hour(s)).  No results found.  ASSESSMENT AND  PLAN  Wilmon PaliSebastian was seen today for medication refill and ear pain.  Diagnoses and all orders for this visit:  Infective otitis externa of left ear: This appears to be early. See exam.   -     acetic acid-hydrocortisone (VOSOL-HC) otic solution; Place 3 drops into the left ear 2 (two) times daily.  Medication refill: He is taking roughly 1 tab weekly. He is ammenable to taking Tylenol 1000 mg q8 to try and reduce his need for NSIADS.  -     ibuprofen (ADVIL,MOTRIN) 800 MG tablet; Take 1 tablet (800 mg total) by mouth every 6 (six) hours as needed.    The patient is advised to call or return to clinic if he does not see an improvement in symptoms, or to seek the care of the closest emergency department if he worsens with the above plan.   Deliah BostonMichael Ioan Landini, MHS, PA-C [facility] 01/24/2016 3:56 PM

## 2016-10-29 ENCOUNTER — Other Ambulatory Visit: Payer: Self-pay | Admitting: Urgent Care

## 2016-10-29 DIAGNOSIS — I10 Essential (primary) hypertension: Secondary | ICD-10-CM

## 2016-12-03 ENCOUNTER — Ambulatory Visit: Payer: BLUE CROSS/BLUE SHIELD | Admitting: Urgent Care

## 2016-12-03 ENCOUNTER — Encounter: Payer: Self-pay | Admitting: Urgent Care

## 2016-12-03 VITALS — BP 145/94 | HR 84 | Temp 98.1°F | Resp 18 | Ht 69.0 in | Wt 177.4 lb

## 2016-12-03 DIAGNOSIS — L299 Pruritus, unspecified: Secondary | ICD-10-CM | POA: Diagnosis not present

## 2016-12-03 DIAGNOSIS — J069 Acute upper respiratory infection, unspecified: Secondary | ICD-10-CM | POA: Diagnosis not present

## 2016-12-03 DIAGNOSIS — R030 Elevated blood-pressure reading, without diagnosis of hypertension: Secondary | ICD-10-CM | POA: Diagnosis not present

## 2016-12-03 DIAGNOSIS — R21 Rash and other nonspecific skin eruption: Secondary | ICD-10-CM | POA: Diagnosis not present

## 2016-12-03 DIAGNOSIS — I10 Essential (primary) hypertension: Secondary | ICD-10-CM

## 2016-12-03 DIAGNOSIS — B9789 Other viral agents as the cause of diseases classified elsewhere: Secondary | ICD-10-CM

## 2016-12-03 MED ORDER — LISINOPRIL 20 MG PO TABS: 20.0000 mg | ORAL_TABLET | Freq: Every day | ORAL | 1 refills | Status: DC

## 2016-12-03 MED ORDER — CETIRIZINE HCL 10 MG PO TABS: 10.0000 mg | ORAL_TABLET | Freq: Every day | ORAL | 11 refills | Status: DC

## 2016-12-03 MED ORDER — HYDROCODONE-HOMATROPINE 5-1.5 MG/5ML PO SYRP: 5.0000 mL | ORAL_SOLUTION | Freq: Every evening | ORAL | 0 refills | Status: DC | PRN

## 2016-12-03 MED ORDER — CLOTRIMAZOLE-BETAMETHASONE 1-0.05 % EX CREA
1.0000 | TOPICAL_CREAM | Freq: Two times a day (BID) | CUTANEOUS | 0 refills | Status: DC
Start: 2016-12-03 — End: 2017-03-12

## 2016-12-03 MED ORDER — BENZONATATE 100 MG PO CAPS: 100.0000 mg | ORAL_CAPSULE | Freq: Three times a day (TID) | ORAL | 0 refills | Status: DC | PRN

## 2016-12-03 NOTE — Patient Instructions (Addendum)
Para el dolor de garganta intente usar un t de miel. Use 3 cucharaditas de miel con jugo exprimido de CBS Corporationmedio limn. Coloque las piezas de Bulgariajengibre afeitadas en 1/2 - 1 taza de agua y caliente sobre la estufa. Luego mezcle los ingredientes y repita cada 4 horas.     Hipertensin Hypertension El trmino hipertensin es otra forma de denominar a la presin arterial elevada. La presin arterial elevada fuerza al corazn a trabajar ms para bombear la sangre. Esto puede causar problemas con el paso del Leavittsburgtiempo. Una lectura de presin arterial est compuesta por 2 nmeros. Hay un nmero superior (sistlico) sobre un nmero inferior (diastlico). Lo ideal es tener la presin arterial por debajo de 120/80. Las decisiones saludables pueden ayudarle a disminuir su presin arterial. Es posible que necesite medicamentos que le ayuden a disminuir su presin arterial si:  Su presin arterial no disminuye mediante decisiones saludables.  Su presin arterial est por encima de 130/80.  Siga estas instrucciones en su casa: Comida y bebida  Si se lo indican, siga el plan de alimentacin de DASH (Dietary Approaches to Stop Hypertension, Maneras de alimentarse para detener la hipertensin). Esta dieta incluye: ? Que la mitad del plato de cada comida sea de frutas y verduras. ? Que un cuarto del plato de cada comida sea de cereales integrales. Los cereales integrales incluyen pasta integral, arroz integral y pan integral. ? Comer y beber productos lcteos con bajo contenido de Helengrasa, como leche descremada o yogur bajo en grasas. ? Que un cuarto del plato de cada comida sea de protenas bajas en grasa (magras). Las protenas bajas en grasa incluyen pescado, pollo sin piel, huevos, frijoles y tofu. ? Evitar consumir carne grasa, carne curada y procesada, o pollo con piel. ? Evitar consumir alimentos prehechos o procesados.  Consuma menos de 1500 mg de sal (sodio) por da.  Limite el consumo de alcohol a no ms de  1 medida por da si es mujer y no est Orthoptistembarazada y a 2 medidas por da si es hombre. Una medida equivale a 12onzas de cerveza, 5onzas de vino o 1onzas de bebidas alcohlicas de alta graduacin. Estilo de vida  Trabaje con su mdico para mantenerse en un peso saludable o para perder peso. Pregntele a su mdico cul es el peso recomendable para usted.  Realice al menos 30 minutos de ejercicio que haga que se acelere su corazn (ejercicio Magazine features editoraerbico) la DIRECTVmayora de los das de la [place]semana. Estos pueden incluir caminar, nadar o andar en bicicleta.  Realice al menos 30 minutos de ejercicio que fortalezca sus msculos (ejercicios de resistencia) al menos 3 das a la Peoasemana. Estos pueden incluir levantar pesas o hacer pilates.  No consuma ningn producto que contenga nicotina o tabaco. Esto incluye cigarrillos y cigarrillos electrnicos. Si necesita ayuda para dejar de fumar, consulte al American Expressmdico.  Controle su presin arterial en su casa tal como le indic el mdico.  Concurra a todas las visitas de control como se lo haya indicado el mdico. Esto es importante. Medicamentos  Baxter Internationalome los medicamentos de venta libre y los recetados solamente como se lo haya indicado el mdico. Siga cuidadosamente las indicaciones.  No omita las dosis de medicamentos para la presin arterial. Los medicamentos pierden eficacia si omite dosis. El hecho de omitir las dosis tambin Lesothoaumenta el riesgo de otros problemas.  Pregntele a su mdico a qu efectos secundarios o reacciones a los Museum/gallery curatormedicamentos debe prestar atencin. Comunquese con un mdico si:  Piensa que tiene Air Products and Chemicalsuna  reaccin a los medicamentos que est tomando.  Tiene dolores de cabeza frecuentes (recurrentes).  Siente mareos.  Tiene hinchazn en los tobillos.  Tiene problemas de visin. Solicite ayuda de inmediato si:  Siente un dolor de cabeza muy intenso.  Comienza a sentirse confundido.  Se siente dbil o adormecido.  Siente que va a  desmayarse.  Siente un dolor muy intenso en: ? El pecho. ? El vientre (abdomen).  Devuelve (vomita) ms de una vez.  Tiene dificultad para respirar. Resumen  El trmino hipertensin es otra forma de denominar a la presin arterial elevada.  Las decisiones saludables pueden ayudarle a disminuir su presin arterial. Si no puede controlar su presin arterial mediante decisiones saludables, es posible que deba tomar medicamentos. Esta informacin no tiene Theme park managercomo fin reemplazar el consejo del mdico. Asegrese de hacerle al mdico cualquier pregunta que tenga. Document Released: 06/28/2009 Document Revised: 12/21/2015 Document Reviewed: 12/21/2015 Elsevier Interactive Patient Education  2018 [employer]   Tos en los adultos (Cough, Adult) La tos es un reflejo que limpia la garganta y las vas respiratorias, y ayuda a la curacin y Training and development officerla proteccin de los pulmones. Es normal toser de Teacher, English as a foreign languagevez en cuando, pero cuando esta se presenta con otros sntomas o dura mucho tiempo puede ser el signo de una enfermedad que Kongiganaknecesita tratamiento. La tos puede durar solo 2 o 3semanas (aguda) o ms de 8semanas (crnica). CAUSAS Comnmente, las causas de la tos son las siguientes:  Visual merchandisernhalar sustancias que Sealed Air Corporationirritan los pulmones.  Una infeccin respiratoria viral o bacteriana.  Alergias.  Asma.  Goteo posnasal.  Fumar.  El retroceso de cido estomacal hacia el esfago (reflujo gastroesofgico).  Algunos medicamentos.  Los problemas pulmonares crnicos, entre ellos, la enfermedad pulmonar obstructiva crnica (EPOC) (o, en contadas ocasiones, el cncer de pulmn).  Otras afecciones, como la insuficiencia cardaca. INSTRUCCIONES PARA EL CUIDADO EN EL HOGAR Est atento a cualquier cambio en los sntomas. Tome estas medidas para Paramedicaliviar las molestias:  Tome los medicamentos solamente como se lo haya indicado el mdico. ? Si le recetaron un antibitico, tmelo como se lo haya indicado el mdico. No deje de  tomar los antibiticos aunque comience a Actorsentirse mejor. ? Hable con el mdico antes de tomar un antitusivo.  Beba suficiente lquido para Photographermantener la orina clara o de color amarillo plido.  Si el aire est seco, use un vaporizador o un humidificador con vapor fro en su habitacin o en su casa para ayudar a aflojar las secreciones.  Evite todas las cosas que le producen tos en el trabajo o en su casa.  Si la tos aumenta durante la noche, intente dormir semisentado.  Evite el humo del cigarrillo. Si fuma, deje de hacerlo. Si necesita ayuda para dejar de fumar, consulte al mdico.  Evite la cafena.  Evite el alcohol.  Descanse todo lo que sea necesario. SOLICITE ATENCIN MDICA SI:  Aparecen nuevos sntomas.  Expectora pus al toser.  La tos no mejora despus de 2 o 3semanas, o empeora.  No puede controlar la tos con antitusivos y no puede dormir bien.  Tiene un dolor que se intensifica o que no puede Sales promotion account executivecontrolar con analgsicos.  Tiene fiebre.  Baja de peso sin causa aparente.  Tiene transpiracin nocturna.  SOLICITE ATENCIN MDICA DE INMEDIATO SI:  Tose y escupe sangre.  Tiene dificultad para respirar.  Los latidos cardacos son muy rpidos.  Esta informacin no tiene Theme park managercomo fin reemplazar el consejo del mdico. Asegrese de hacerle al mdico cualquier pregunta que tenga.  Document Released: 08/16/2010 Document Revised: 09/29/2014 Document Reviewed: 03/17/2014 Elsevier Interactive Patient Education  2017 [employer].     IF you received an x-ray today, you will receive an invoice from [facility]. Please contact [facility] at [phone] with questions or concerns regarding your invoice.   IF you received labwork today, you will receive an invoice from [facility]. Please contact [facility] at [phone] with questions or concerns regarding your invoice.   Our billing staff will not be able to assist you with questions regarding bills from  these companies.  You will be contacted with the lab results as soon as they are available. The fastest way to get your results is to activate your My Chart account. Instructions are located on the last page of this paperwork. If you have not heard from [place] regarding the results in 2 weeks, please contact this office.

## 2016-12-03 NOTE — Progress Notes (Addendum)
MRN: [id number] DOB: February 27, 1960  Subjective:   Kenneth Wilkerson is a 56 y.o. male presenting for chief complaint of Cough (x1 week or more); Nasal Congestion (x1 week; states feels like he not getting enough oxygen); and Tinea (possible ring worm on left wrist area)  Cough - Reports 1 week history productive cough, sinus headaches, nasal congestion, sinus pain. Cough elicits chest pain, wheezing, throat pain. Has tried otc cold medications for sinuses, ibuprofen. Denies fever, ear pain, shob, n/v, abdominal pain, rashes. Denies asthma, allergies. Denies smoking cigarettes.   Rash - Reports 1 week history of intermittent itchy rash over his left wrist. Has tried Vaseline, vinegar, otc cream. Denies drainage of pus, bleeding, stinging, pain. Wears gloves at work that cover his wrist.   HTN - Managed with lisinopril. Checks BP at home, states that it runs normal but cannot recall his readings. Avoids salt, does not eat out a lot. Does not exercise. ROS as above, also denies hematuria, lower leg swelling.   Kenneth Wilkerson has a current medication list which includes the following prescription(s): ibuprofen and lisinopril. Also is allergic to amoxicillin; darvocet [propoxyphene n-acetaminophen]; and sulfa antibiotics.  Kenneth Wilkerson  has a past medical history of Elevated bilirubin (04/07/2013), Erythrocytosis (04/07/2013), Hemorrhoids, Hyperlipidemia, Hypertension, Tuberculosis, and Vitamin D deficiency. Also  has a past surgical history that includes Hemorrhoid surgery.  Objective:   Vitals: BP (!) 145/94   Pulse 84   Temp 98.1 F (36.7 C) (Oral)   Resp 18   Ht 5\' 9"  (1.753 m)   Wt 177 lb 6.4 oz (80.5 kg)   SpO2 98%   BMI 26.20 kg/m   BP Readings from Last 3 Encounters:  12/03/16 (!) 145/94  01/24/16 126/76  08/16/15 (!) 142/90   The 10-year ASCVD risk score Denman George(Goff DC Jr., et al., 2013) is: 7.4%   Values used to calculate the score:     Age: [age] years     Sex: Male     Is Non-Hispanic  African American: No     Diabetic: No     Tobacco smoker: No     Systolic Blood Pressure: 145 mmHg     Is BP treated: Yes     HDL Cholesterol: 35 mg/dL     Total Cholesterol: 153 mg/dL   Physical Exam  Constitutional: He is oriented to person, place, and time. He appears well-developed and well-nourished.  HENT:  TM's intact bilaterally, no effusions or erythema. Nasal turbinates pink and moist, nasal passages patent. No sinus tenderness. Oropharynx clear, mucous membranes moist.  Eyes: Right eye exhibits no discharge. Left eye exhibits no discharge. No scleral icterus.  Neck: Normal range of motion. Neck supple.  Cardiovascular: Normal rate, regular rhythm and intact distal pulses. Exam reveals no gallop and no friction rub.  No murmur heard. Pulmonary/Chest: No respiratory distress. He has no wheezes. He has no rales.  Abdominal: Soft. Bowel sounds are normal. He exhibits no distension and no mass. There is no tenderness. There is no guarding.  Musculoskeletal: He exhibits no edema.  Lymphadenopathy:    He has no cervical adenopathy.  Neurological: He is alert and oriented to person, place, and time.  Skin: Skin is warm and dry. Rash (annular erythematous lesion over volar surface of left wrist with multiple excoriations and macerated skin over crease of wrist/palm) noted.  Psychiatric: He has a normal mood and affect.     Assessment and Plan :   1. Essential hypertension 2. Elevated blood pressure reading - Refilled lisinopril.  Illness, lack of sleep may be contributing to elevated BP today. Will monitor his BP, patient agreed to check it at home and rtc in 4 weeks if his BP remains greater than 140's systolic. Will look to add amlodipine at that point.  - Microalbumin/Creatinine Ratio, Urine  3. Rash and nonspecific skin eruption 4. Itching - I suspect a contact dermatitis from sweating, gloves he wears at work. Will cover for fungal infection as well. Use  clotrimazole-betamethasone for 2 weeks.  5. Viral URI with cough - Start supportive care for what I suspect is a viral illness. Cough suppression medications offered. Return-to-clinic precautions discussed, patient verbalized understanding.   Wallis BambergMario Dalexa Gentz, PA-C [facility] [phone] 12/03/2016  8:15 AM

## 2016-12-04 LAB — MICROALBUMIN / CREATININE URINE RATIO: CREATININE, UR: 65.4 mg/dL

## 2016-12-04 LAB — COMPREHENSIVE METABOLIC PANEL
A/G RATIO: 1.2 (ref 1.2–2.2)
ALBUMIN: 4.5 g/dL (ref 3.5–5.5)
ALT: 33 IU/L (ref 0–44)
AST: 26 IU/L (ref 0–40)
Alkaline Phosphatase: 94 IU/L (ref 39–117)
BUN / CREAT RATIO: 12 (ref 9–20)
BUN: 9 mg/dL (ref 6–24)
Bilirubin Total: 0.7 mg/dL (ref 0.0–1.2)
CALCIUM: 9.6 mg/dL (ref 8.7–10.2)
CO2: 25 mmol/L (ref 20–29)
Chloride: 97 mmol/L (ref 96–106)
Creatinine, Ser: 0.74 mg/dL — ABNORMAL LOW (ref 0.76–1.27)
GFR calc Af Amer: 121 mL/min/{1.73_m2} (ref >59)
GFR, EST NON AFRICAN AMERICAN: 105 mL/min/{1.73_m2} (ref >59)
GLOBULIN, TOTAL: 3.7 g/dL (ref 1.5–4.5)
Glucose: 94 mg/dL (ref 65–99)
POTASSIUM: 4.3 mmol/L (ref 3.5–5.2)
SODIUM: 140 mmol/L (ref 134–144)
Total Protein: 8.2 g/dL (ref 6.0–8.5)

## 2016-12-04 LAB — LIPID PANEL
Chol/HDL Ratio: 4.4 ratio (ref 0.0–5.0)
Cholesterol, Total: 153 mg/dL (ref 100–199)
HDL: 35 mg/dL — ABNORMAL LOW (ref >39)
LDL Calculated: 100 mg/dL — ABNORMAL HIGH (ref 0–99)
TRIGLYCERIDES: 92 mg/dL (ref 0–149)
VLDL Cholesterol Cal: 18 mg/dL (ref 5–40)

## 2016-12-11 ENCOUNTER — Encounter: Payer: Self-pay | Admitting: Urgent Care

## 2016-12-12 ENCOUNTER — Telehealth: Payer: Self-pay | Admitting: Physician Assistant

## 2016-12-12 ENCOUNTER — Telehealth: Payer: Self-pay

## 2016-12-12 NOTE — Telephone Encounter (Signed)
Copied from CRM [id number]. Topic: Quick Communication - Lab Results >> Dec 12, 2016 11:12 AM Floria RavelingStovall, Shana A wrote: pt called in and would like some one to give him a call back lab work he just had done a couple days ago?

## 2016-12-12 NOTE — Telephone Encounter (Signed)
Letter sent will call.  Copied from CRM [id number]. Topic: Quick Communication - Lab Results >> Dec 12, 2016 11:12 AM Floria RavelingStovall, Shana A wrote: pt called in and would like some one to give him a call back lab work he just had done a couple days ago?

## 2016-12-14 NOTE — Telephone Encounter (Signed)
Duplicate encounter, closing note.  

## 2017-02-25 ENCOUNTER — Encounter: Payer: Self-pay | Admitting: Emergency Medicine

## 2017-02-25 ENCOUNTER — Ambulatory Visit: Payer: BLUE CROSS/BLUE SHIELD | Admitting: Emergency Medicine

## 2017-02-25 ENCOUNTER — Other Ambulatory Visit: Payer: Self-pay

## 2017-02-25 VITALS — BP 108/66 | HR 83 | Temp 97.9°F | Resp 16 | Ht 68.25 in | Wt 179.8 lb

## 2017-02-25 DIAGNOSIS — R21 Rash and other nonspecific skin eruption: Secondary | ICD-10-CM

## 2017-02-25 DIAGNOSIS — L259 Unspecified contact dermatitis, unspecified cause: Secondary | ICD-10-CM

## 2017-02-25 MED ORDER — TRIAMCINOLONE ACETONIDE 0.1 % EX CREA: 1.0000 "application " | TOPICAL_CREAM | Freq: Two times a day (BID) | CUTANEOUS | 0 refills | Status: DC

## 2017-02-25 MED ORDER — PREDNISONE 20 MG PO TABS: 40.0000 mg | ORAL_TABLET | Freq: Every day | ORAL | 0 refills | Status: AC

## 2017-02-25 NOTE — Patient Instructions (Addendum)
     IF you received an x-ray today, you will receive an invoice from [facility]. Please contact [facility] at [phone] with questions or concerns regarding your invoice.   IF you received labwork today, you will receive an invoice from [facility]. Please contact [facility] at [phone] with questions or concerns regarding your invoice.   Our billing staff will not be able to assist you with questions regarding bills from these companies.  You will be contacted with the lab results as soon as they are available. The fastest way to get your results is to activate your My Chart account. Instructions are located on the last page of this paperwork. If you have not heard from us regarding the results in 2 weeks, please contact this office.     Contact Dermatitis Dermatitis is redness, soreness, and swelling (inflammation) of the skin. Contact dermatitis is a reaction to certain substances that touch the skin. You either touched something that irritated your skin, or you have allergies to something you touched. Follow these instructions at home: Skin Care  Moisturize your skin as needed.  Apply cool compresses to the affected areas.  Try taking a bath with: ? Epsom salts. Follow the instructions on the package. You can get these at a pharmacy or grocery store. ? Baking soda. Pour a small amount into the bath as told by your doctor. ? Colloidal oatmeal. Follow the instructions on the package. You can get this at a pharmacy or grocery store.  Try applying baking soda paste to your skin. Stir water into baking soda until it looks like paste.  Do not scratch your skin.  Bathe less often.  Bathe in lukewarm water. Avoid using hot water. Medicines  Take or apply over-the-counter and prescription medicines only as told by your doctor.  If you were prescribed an antibiotic medicine, take or apply your antibiotic as told by your doctor. Do not stop taking the antibiotic  even if your condition starts to get better. General instructions  Keep all follow-up visits as told by your doctor. This is important.  Avoid the substance that caused your reaction. If you do not know what caused it, keep a journal to try to track what caused it. Write down: ? What you eat. ? What cosmetic products you use. ? What you drink. ? What you wear in the affected area. This includes jewelry.  If you were given a bandage (dressing), take care of it as told by your doctor. This includes when to change and remove it. Contact a doctor if:  You do not get better with treatment.  Your condition gets worse.  You have signs of infection such as: ? Swelling. ? Tenderness. ? Redness. ? Soreness. ? Warmth.  You have a fever.  You have new symptoms. Get help right away if:  You have a very bad headache.  You have neck pain.  Your neck is stiff.  You throw up (vomit).  You feel very sleepy.  You see red streaks coming from the affected area.  Your bone or joint underneath the affected area becomes painful after the skin has healed.  The affected area turns darker.  You have trouble breathing. This information is not intended to replace advice given to you by your health care provider. Make sure you discuss any questions you have with your health care provider. Document Released: 11/05/2008 Document Revised: 06/16/2015 Document Reviewed: 05/26/2014 Elsevier Interactive Patient Education  2018 Elsevier Inc.  

## 2017-02-25 NOTE — Progress Notes (Signed)
Kenneth Wilkerson 57 y.o.   Chief Complaint  Patient presents with  . Rash    LEFT forearm with itching x 2 months    HISTORY OF PRESENT ILLNESS: This is a 56 y.o. male complaining of persistent rash to the left wrist area since last November.  Was seen on  November 12 and started on Lotrisone twice a day with minimal improvement.  Symptoms the same.  No new findings.  No new symptomatology.  HPI   Prior to Admission medications   Medication Sig Start Date End Date Taking? Authorizing Provider  ibuprofen (ADVIL,MOTRIN) 800 MG tablet Take 1 tablet (800 mg total) by mouth every 6 (six) hours as needed. 01/24/16  Yes Ofilia Neas, PA-C  lisinopril (PRINIVIL,ZESTRIL) 20 MG tablet Take 1 tablet (20 mg total) daily by mouth. 12/03/16  Yes Wallis Bamberg, PA-C  cetirizine (ZYRTEC) 10 MG tablet Take 1 tablet (10 mg total) daily by mouth. Patient not taking: Reported on 02/25/2017 12/03/16   Wallis Bamberg, PA-C  clotrimazole-betamethasone (LOTRISONE) cream Apply 1 application 2 (two) times daily topically. Patient not taking: Reported on 02/25/2017 12/03/16   Wallis Bamberg, PA-C    Allergies  Allergen Reactions  . Amoxicillin Other (See Comments)    Shaking/ tremors  . Darvocet [Propoxyphene N-Acetaminophen] Other (See Comments)    HA, NERVOUSNESS  . Sulfa Antibiotics Other (See Comments)    Dizziness, shakey, sweats    Patient Active Problem List   Diagnosis Date Noted  . Essential hypertension 08/09/2014  . Erythrocytosis 04/07/2013  . Elevated bilirubin 04/07/2013  . Hypertension   . Hemorrhoids     Past Medical History:  Diagnosis Date  . Elevated bilirubin 04/07/2013  . Erythrocytosis 04/07/2013  . Hemorrhoids   . Hyperlipidemia   . Hypertension   . Tuberculosis    Treated for in 2000  . Vitamin D deficiency     Past Surgical History:  Procedure Laterality Date  . HEMORRHOID SURGERY      Social History   Socioeconomic History  . Marital status: Legally Separated      Spouse name: Not on file  . Number of children: 5  . Years of education: 5th grade  . Highest education level: Not on file  Social Needs  . Financial resource strain: Not on file  . Food insecurity - worry: Not on file  . Food insecurity - inability: Not on file  . Transportation needs - medical: Not on file  . Transportation needs - non-medical: Not on file  Occupational History  . Occupation: Equities trader: [employer]  Tobacco Use  . Smoking status: Never Smoker  . Smokeless tobacco: Never Used  Substance and Sexual Activity  . Alcohol use: Yes    Alcohol/week: 1.8 - 3.0 oz    Types: 3 Cans of beer per week    Comment: occ 3 times a week  . Drug use: No  . Sexual activity: No  Other Topics Concern  . Not on file  Social History Narrative   Lives with his girlfriend.  His wife (they are separated) and 5 children live in [place].   He is from [place], [place].  Arrived in the [place] in 1997.    No family history on file.   Review of Systems  Constitutional: Negative.  Negative for chills and fever.  HENT: Negative for sore throat.   Eyes: Negative for discharge and redness.  Respiratory: Negative for shortness of breath.   Cardiovascular: Negative for  chest pain and palpitations.  Gastrointestinal: Negative for abdominal pain, nausea and vomiting.  Musculoskeletal: Negative for myalgias and neck pain.  Skin: Positive for itching and rash.  Neurological: Negative for dizziness and headaches.  Endo/Heme/Allergies: Negative.      Vitals:   02/25/17 0812  BP: 108/66  Pulse: 83  Resp: 16  Temp: 97.9 F (36.6 C)  SpO2: 98%     Physical Exam  Constitutional: He is oriented to person, place, and time. He appears well-developed and well-nourished.  HENT:  Head: Normocephalic and atraumatic.  Nose: Nose normal.  Mouth/Throat: Oropharynx is clear and moist.  Eyes: EOM are normal. Pupils are equal, round, and reactive to light.  Neck: Normal  range of motion. Neck supple.  Cardiovascular: Normal rate.  Pulmonary/Chest: Effort normal.  Musculoskeletal: Normal range of motion.  Neurological: He is alert and oriented to person, place, and time.  Skin: Capillary refill takes less than 2 seconds. Rash noted.  Psychiatric: He has a normal mood and affect. His behavior is normal.  Vitals reviewed.      ASSESSMENT & PLAN: Kenneth Wilkerson was seen today for rash.  Diagnoses and all orders for this visit:  Rash and nonspecific skin eruption -     triamcinolone cream (KENALOG) 0.1 %; Apply 1 application topically 2 (two) times daily. -     predniSONE (DELTASONE) 20 MG tablet; Take 2 tablets (40 mg total) by mouth daily with breakfast for 5 days.  Contact dermatitis, unspecified contact dermatitis type, unspecified trigger -     triamcinolone cream (KENALOG) 0.1 %; Apply 1 application topically 2 (two) times daily. -     predniSONE (DELTASONE) 20 MG tablet; Take 2 tablets (40 mg total) by mouth daily with breakfast for 5 days.    Patient Instructions       IF you received an x-ray today, you will receive an invoice from [facility]. Please contact [facility] at [phone] with questions or concerns regarding your invoice.   IF you received labwork today, you will receive an invoice from [facility]. Please contact [facility] at [phone] with questions or concerns regarding your invoice.   Our billing staff will not be able to assist you with questions regarding bills from these companies.  You will be contacted with the lab results as soon as they are available. The fastest way to get your results is to activate your My Chart account. Instructions are located on the last page of this paperwork. If you have not heard from us regarding the results in 2 weeks, please contact this office.     Contact Dermatitis Dermatitis is redness, soreness, and swelling (inflammation) of the skin. Contact dermatitis is a  reaction to certain substances that touch the skin. You either touched something that irritated your skin, or you have allergies to something you touched. Follow these instructions at home: Skin Care  Moisturize your skin as needed.  Apply cool compresses to the affected areas.  Try taking a bath with: ? Epsom salts. Follow the instructions on the package. You can get these at a pharmacy or grocery store. ? Baking soda. Pour a small amount into the bath as told by your doctor. ? Colloidal oatmeal. Follow the instructions on the package. You can get this at a pharmacy or grocery store.  Try applying baking soda paste to your skin. Stir water into baking soda until it looks like paste.  Do not scratch your skin.  Bathe less often.  Bathe in lukewarm water. Avoid  using hot water. Medicines  Take or apply over-the-counter and prescription medicines only as told by your doctor.  If you were prescribed an antibiotic medicine, take or apply your antibiotic as told by your doctor. Do not stop taking the antibiotic even if your condition starts to get better. General instructions  Keep all follow-up visits as told by your doctor. This is important.  Avoid the substance that caused your reaction. If you do not know what caused it, keep a journal to try to track what caused it. Write down: ? What you eat. ? What cosmetic products you use. ? What you drink. ? What you wear in the affected area. This includes jewelry.  If you were given a bandage (dressing), take care of it as told by your doctor. This includes when to change and remove it. Contact a doctor if:  You do not get better with treatment.  Your condition gets worse.  You have signs of infection such as: ? Swelling. ? Tenderness. ? Redness. ? Soreness. ? Warmth.  You have a fever.  You have new symptoms. Get help right away if:  You have a very bad headache.  You have neck pain.  Your neck is stiff.  You throw  up (vomit).  You feel very sleepy.  You see red streaks coming from the affected area.  Your bone or joint underneath the affected area becomes painful after the skin has healed.  The affected area turns darker.  You have trouble breathing. This information is not intended to replace advice given to you by your health care provider. Make sure you discuss any questions you have with your health care provider. Document Released: 11/05/2008 Document Revised: 06/16/2015 Document Reviewed: 05/26/2014 Elsevier Interactive Patient Education  2018 Elsevier Inc.      Edwina Barth, MD [facility]

## 2017-03-12 ENCOUNTER — Encounter: Payer: Self-pay | Admitting: Emergency Medicine

## 2017-03-12 ENCOUNTER — Ambulatory Visit: Payer: BLUE CROSS/BLUE SHIELD | Admitting: Emergency Medicine

## 2017-03-12 VITALS — BP 122/72 | HR 88 | Temp 98.5°F | Resp 17 | Ht 68.0 in | Wt 179.0 lb

## 2017-03-12 DIAGNOSIS — L259 Unspecified contact dermatitis, unspecified cause: Secondary | ICD-10-CM | POA: Diagnosis not present

## 2017-03-12 DIAGNOSIS — I10 Essential (primary) hypertension: Secondary | ICD-10-CM | POA: Diagnosis not present

## 2017-03-12 DIAGNOSIS — R21 Rash and other nonspecific skin eruption: Secondary | ICD-10-CM | POA: Diagnosis not present

## 2017-03-12 MED ORDER — KETOCONAZOLE 2 % EX CREA: 1.0000 "application " | TOPICAL_CREAM | Freq: Every day | CUTANEOUS | 0 refills | Status: DC

## 2017-03-12 MED ORDER — TRIAMCINOLONE ACETONIDE 0.1 % EX CREA: 1.0000 "application " | TOPICAL_CREAM | Freq: Two times a day (BID) | CUTANEOUS | 0 refills | Status: DC

## 2017-03-12 MED ORDER — LISINOPRIL 20 MG PO TABS: 20.0000 mg | ORAL_TABLET | Freq: Every day | ORAL | 1 refills | Status: DC

## 2017-03-12 NOTE — Progress Notes (Signed)
Kenneth Wilkerson 57 y.o.   Chief Complaint  Patient presents with  . Follow-up    rash     HISTORY OF PRESENT ILLNESS: This is a 57 y.o. male seen by me 15 days ago for a rash on the left wrist area.  Started on prednisone and triamcinolone.  Did well.  Area improved.  Still has some residual rash. Also complaining of intermittent episodes of short-lived sharp pains to the left auricular area.  No other significant symptoms. Also requesting refill of lisinopril.  Has history of hypertension.  Will be traveling to [place] next Friday.  HPI   Prior to Admission medications   Medication Sig Start Date End Date Taking? Authorizing Provider  ibuprofen (ADVIL,MOTRIN) 800 MG tablet Take 1 tablet (800 mg total) by mouth every 6 (six) hours as needed. 01/24/16  Yes Kenneth Neas, PA-C  lisinopril (PRINIVIL,ZESTRIL) 20 MG tablet Take 1 tablet (20 mg total) daily by mouth. 12/03/16  Yes Kenneth Bamberg, PA-C  triamcinolone cream (KENALOG) 0.1 % Apply 1 application topically 2 (two) times daily. 02/25/17  Yes Kenneth Quint, MD    Allergies  Allergen Reactions  . Amoxicillin Other (See Comments)    Shaking/ tremors  . Darvocet [Propoxyphene N-Acetaminophen] Other (See Comments)    HA, NERVOUSNESS  . Sulfa Antibiotics Other (See Comments)    Dizziness, shakey, sweats    Patient Active Problem List   Diagnosis Date Noted  . Rash and nonspecific skin eruption 02/25/2017  . Contact dermatitis 02/25/2017  . Essential hypertension 08/09/2014  . Erythrocytosis 04/07/2013  . Elevated bilirubin 04/07/2013  . Hypertension   . Hemorrhoids     Past Medical History:  Diagnosis Date  . Elevated bilirubin 04/07/2013  . Erythrocytosis 04/07/2013  . Hemorrhoids   . Hyperlipidemia   . Hypertension   . Tuberculosis    Treated for in 2000  . Vitamin D deficiency     Past Surgical History:  Procedure Laterality Date  . HEMORRHOID SURGERY      Social History   Socioeconomic History   . Marital status: Legally Separated    Spouse name: Not on file  . Number of children: 5  . Years of education: 5th grade  . Highest education level: Not on file  Social Needs  . Financial resource strain: Not on file  . Food insecurity - worry: Not on file  . Food insecurity - inability: Not on file  . Transportation needs - medical: Not on file  . Transportation needs - non-medical: Not on file  Occupational History  . Occupation: Equities trader: [employer]  Tobacco Use  . Smoking status: Never Smoker  . Smokeless tobacco: Never Used  Substance and Sexual Activity  . Alcohol use: Yes    Alcohol/week: 1.8 - 3.0 oz    Types: 3 Cans of beer per week    Comment: occ 3 times a week  . Drug use: No  . Sexual activity: No  Other Topics Concern  . Not on file  Social History Narrative   Lives with his girlfriend.  His wife (they are separated) and 5 children live in [place].   He is from [place], [place].  Arrived in the [place] in 1997.    No family history on file.   Review of Systems  Constitutional: Negative.  Negative for chills and fever.  HENT: Positive for ear pain (Left ear).   Eyes: Negative.   Respiratory: Negative.  Negative for cough and  shortness of breath.   Cardiovascular: Negative.  Negative for chest pain and palpitations.  Gastrointestinal: Negative for abdominal pain, diarrhea, nausea and vomiting.  Genitourinary: Negative.   Skin: Positive for rash.  Neurological: Negative.  Negative for dizziness and headaches.  Endo/Heme/Allergies: Negative.   All other systems reviewed and are negative.   Vitals:   03/12/17 0758  BP: 122/72  Pulse: 88  Resp: 17  Temp: 98.5 F (36.9 C)  SpO2: 98%    Physical Exam  Constitutional: He is oriented to person, place, and time. He appears well-developed and well-nourished.  HENT:  Head: Normocephalic and atraumatic.  Right Ear: Hearing, tympanic membrane, external ear and ear canal normal.    Left Ear: Hearing, tympanic membrane, external ear and ear canal normal.  Nose: Nose normal.  Mouth/Throat: Oropharynx is clear and moist.  Eyes: Conjunctivae and EOM are normal. Pupils are equal, round, and reactive to light.  Neck: Normal range of motion. Neck supple. No JVD present. No thyromegaly present.  Cardiovascular: Normal rate, regular rhythm, normal heart sounds and intact distal pulses.  Pulmonary/Chest: Effort normal and breath sounds normal.  Abdominal: Soft. Bowel sounds are normal.  Musculoskeletal: Normal range of motion.  Lymphadenopathy:    He has no cervical adenopathy.  Neurological: He is alert and oriented to person, place, and time. No sensory deficit. He exhibits normal muscle tone.  Skin: Rash (improved) noted.  Vitals reviewed.       ASSESSMENT & PLAN: Kenneth Wilkerson was seen today for follow-up.  Diagnoses and all orders for this visit:  Rash and nonspecific skin eruption -     triamcinolone cream (KENALOG) 0.1 %; Apply 1 application topically 2 (two) times daily. -     ketoconazole (NIZORAL) 2 % cream; Apply 1 application topically daily.  Essential hypertension -     lisinopril (PRINIVIL,ZESTRIL) 20 MG tablet; Take 1 tablet (20 mg total) by mouth daily.  Contact dermatitis, unspecified contact dermatitis type, unspecified trigger -     triamcinolone cream (KENALOG) 0.1 %; Apply 1 application topically 2 (two) times daily. -     ketoconazole (NIZORAL) 2 % cream; Apply 1 application topically daily.  A total of 25 minutes was spent in the room with the patient, greater than 50% of which was in counseling/coordination of care.   Patient Instructions       IF you received an x-ray today, you will receive an invoice from [facility]. Please contact [facility] at [phone] with questions or concerns regarding your invoice.   IF you received labwork today, you will receive an invoice from [facility]. Please contact [facility] at  [phone] with questions or concerns regarding your invoice.   Our billing staff will not be able to assist you with questions regarding bills from these companies.  You will be contacted with the lab results as soon as they are available. The fastest way to get your results is to activate your My Chart account. Instructions are located on the last page of this paperwork. If you have not heard from [place] regarding the results in 2 weeks, please contact this office.      Dermatitis de contacto (Contact Dermatitis) La dermatitis es el enrojecimiento, el dolor y la hinchazn (inflamacin) de la piel. La dermatitis de contacto es una reaccin a ciertas sustancias que entran en contacto con la piel. Toc algo que le irrit la piel o es alrgico a algo que ha tocado. CUIDADOS EN EL HOGAR Cuidado de la piel  Humctese la  piel segn sea necesario.  Aplique compresas fras en las zonas afectadas.  Trate de tomar un bao con lo siguiente: ? Sales de Epsom. Siga las instrucciones del envase. Puede conseguirlas en la tienda de comestibles o en la farmacia local. ? Bicarbonato de sodio. Vierta un poco en la baera como se lo haya indicado el mdico. ? Avena coloidal. Siga las instrucciones del envase. Puede conseguirla en la tienda de comestibles o en la farmacia local.  Intente colocarse una pasta de bicarbonato de sodio sobre la piel. Agregue agua al bicarbonato de sodio hasta que formar una pasta.  No se rasque la piel.  Bese con menos frecuencia.  Bese con agua templada. No use agua caliente. Medicamentos  Tome o aplique los medicamentos de venta libre y los recetados solamente como se lo haya indicado el mdico.  Si le recetaron un antibitico, tmelo o aplqueselo como se lo haya indicado el mdico. No deje de tomar el antibitico aunque la afeccin empiece a Scientist, clinical (histocompatibility and immunogenetics)mejorar. Instrucciones generales  Concurra a todas las visitas de control como se lo haya indicado el mdico. Esto es  importante.  Evite la sustancia que ha causado la erupcin. Si no sabe qu la caus, lleve un diario para tratar de identificar la causa. Escriba los siguientes datos: ? Lo que come. ? Los cosmticos que [place]. ? Lo que bebe. ? Lo que llev puesto en la zona afectada. Esto incluye las alhajas.  Si le indicaron que use un vendaje, cudelo como se lo haya indicado el mdico. Esto incluye saber cundo cambiarlo y cundo quitrselo. SOLICITE AYUDA SI:  No mejora con el tratamiento.  La afeccin empeora.  Tiene signos de infeccin, por ejemplo: ? Hinchazn. ? Dolor a Insurance claims handlerla palpacin. ? Enrojecimiento. ? Inflamacin. ? Calor.  Tiene fiebre.  Aparecen nuevos sntomas. SOLICITE AYUDA DE INMEDIATO SI:  Siente un dolor de cabeza muy intenso.  Siente dolor en el cuello.  Tiene el cuello rgido.  Vomita.  Se siente muy somnoliento.  Nota unas lneas rojas en la piel que salen de la zona afectada.  El hueso o la articulacin que se encuentran por debajo de la zona afectada le duelen despus de que la piel se haya curado.  La zona afectada se oscurece.  Tiene dificultad para respirar. Esta informacin no tiene Theme park managercomo fin reemplazar el consejo del mdico. Asegrese de hacerle al mdico cualquier pregunta que tenga. Document Released: 09/07/2010 Document Revised: 09/29/2014 Document Reviewed: 05/26/2014 Elsevier Interactive Patient Education  2018 Elsevier Inc.      Edwina BarthMiguel Taraya Steward, MD [facility]

## 2017-03-12 NOTE — Patient Instructions (Addendum)
   IF you received an x-ray today, you will receive an invoice from [facility]. Please contact  Radiology at [phone] with questions or concerns regarding your invoice.   IF you received labwork today, you will receive an invoice from [facility]. Please contact [facility] at [phone] with questions or concerns regarding your invoice.   Our billing staff will not be able to assist you with questions regarding bills from these companies.  You will be contacted with the lab results as soon as they are available. The fastest way to get your results is to activate your My Chart account. Instructions are located on the last page of this paperwork. If you have not heard from us regarding the results in 2 weeks, please contact this office.     Dermatitis de contacto (Contact Dermatitis) La dermatitis es el enrojecimiento, el dolor y la hinchazn (inflamacin) de la piel. La dermatitis de contacto es una reaccin a ciertas sustancias que entran en contacto con la piel. Toc algo que le irrit la piel o es alrgico a algo que ha tocado. CUIDADOS EN EL HOGAR Cuidado de la piel  Humctese la piel segn sea necesario.  Aplique compresas fras en las zonas afectadas.  Trate de tomar un bao con lo siguiente: ? Sales de Epsom. Siga las instrucciones del envase. Puede conseguirlas en la tienda de comestibles o en la farmacia local. ? Bicarbonato de sodio. Vierta un poco en la baera como se lo haya indicado el mdico. ? Avena coloidal. Siga las instrucciones del envase. Puede conseguirla en la tienda de comestibles o en la farmacia local.  Intente colocarse una pasta de bicarbonato de sodio sobre la piel. Agregue agua al bicarbonato de sodio hasta que formar una pasta.  No se rasque la piel.  Bese con menos frecuencia.  Bese con agua templada. No use agua caliente. Medicamentos  Tome o aplique los medicamentos de venta libre y los recetados solamente como se lo haya  indicado el mdico.  Si le recetaron un antibitico, tmelo o aplqueselo como se lo haya indicado el mdico. No deje de tomar el antibitico aunque la afeccin empiece a mejorar. Instrucciones generales  Concurra a todas las visitas de control como se lo haya indicado el mdico. Esto es importante.  Evite la sustancia que ha causado la erupcin. Si no sabe qu la caus, lleve un diario para tratar de identificar la causa. Escriba los siguientes datos: ? Lo que come. ? Los cosmticos que utiliza. ? Lo que bebe. ? Lo que llev puesto en la zona afectada. Esto incluye las alhajas.  Si le indicaron que use un vendaje, cudelo como se lo haya indicado el mdico. Esto incluye saber cundo cambiarlo y cundo quitrselo. SOLICITE AYUDA SI:  No mejora con el tratamiento.  La afeccin empeora.  Tiene signos de infeccin, por ejemplo: ? Hinchazn. ? Dolor a la palpacin. ? Enrojecimiento. ? Inflamacin. ? Calor.  Tiene fiebre.  Aparecen nuevos sntomas. SOLICITE AYUDA DE INMEDIATO SI:  Siente un dolor de cabeza muy intenso.  Siente dolor en el cuello.  Tiene el cuello rgido.  Vomita.  Se siente muy somnoliento.  Nota unas lneas rojas en la piel que salen de la zona afectada.  El hueso o la articulacin que se encuentran por debajo de la zona afectada le duelen despus de que la piel se haya curado.  La zona afectada se oscurece.  Tiene dificultad para respirar. Esta informacin no tiene como fin reemplazar el consejo del mdico. Asegrese   de hacerle al mdico cualquier pregunta que tenga. Document Released: 09/07/2010 Document Revised: 09/29/2014 Document Reviewed: 05/26/2014 Elsevier Interactive Patient Education  2018 Elsevier Inc.  

## 2017-04-29 ENCOUNTER — Encounter: Payer: Self-pay | Admitting: Physician Assistant

## 2017-05-10 ENCOUNTER — Ambulatory Visit: Payer: BLUE CROSS/BLUE SHIELD | Admitting: Emergency Medicine

## 2017-10-14 ENCOUNTER — Other Ambulatory Visit: Payer: Self-pay

## 2017-10-14 ENCOUNTER — Ambulatory Visit (INDEPENDENT_AMBULATORY_CARE_PROVIDER_SITE_OTHER): Payer: BLUE CROSS/BLUE SHIELD | Admitting: Emergency Medicine

## 2017-10-14 ENCOUNTER — Encounter: Payer: Self-pay | Admitting: Emergency Medicine

## 2017-10-14 VITALS — BP 112/73 | HR 75 | Temp 98.4°F | Resp 16 | Ht 67.5 in | Wt 171.4 lb

## 2017-10-14 DIAGNOSIS — I10 Essential (primary) hypertension: Secondary | ICD-10-CM

## 2017-10-14 DIAGNOSIS — Z23 Encounter for immunization: Secondary | ICD-10-CM | POA: Diagnosis not present

## 2017-10-14 DIAGNOSIS — K4020 Bilateral inguinal hernia, without obstruction or gangrene, not specified as recurrent: Secondary | ICD-10-CM | POA: Diagnosis not present

## 2017-10-14 DIAGNOSIS — Z0000 Encounter for general adult medical examination without abnormal findings: Secondary | ICD-10-CM | POA: Diagnosis not present

## 2017-10-14 DIAGNOSIS — Z760 Encounter for issue of repeat prescription: Secondary | ICD-10-CM | POA: Diagnosis not present

## 2017-10-14 MED ORDER — IBUPROFEN 800 MG PO TABS: 800.0000 mg | ORAL_TABLET | Freq: Four times a day (QID) | ORAL | 1 refills | Status: DC | PRN

## 2017-10-14 MED ORDER — LISINOPRIL 20 MG PO TABS: 20.0000 mg | ORAL_TABLET | Freq: Every day | ORAL | 3 refills | Status: DC

## 2017-10-14 NOTE — Patient Instructions (Addendum)
   If you have lab work done today you will be contacted with your lab results within the next 2 weeks.  If you have not heard from us then please contact us. The fastest way to get your results is to register for My Chart.   IF you received an x-ray today, you will receive an invoice from [facility]. Please contact [facility] at [phone] with questions or concerns regarding your invoice.   IF you received labwork today, you will receive an invoice from [facility]. Please contact [facility] at [phone] with questions or concerns regarding your invoice.   Our billing staff will not be able to assist you with questions regarding bills from these companies.  You will be contacted with the lab results as soon as they are available. The fastest way to get your results is to activate your My Chart account. Instructions are located on the last page of this paperwork. If you have not heard from us regarding the results in 2 weeks, please contact this office.      Health Maintenance, Male A healthy lifestyle and preventive care is important for your health and wellness. Ask your health care provider about what schedule of regular examinations is right for you. What should I know about weight and diet? Eat a Healthy Diet  Eat plenty of vegetables, fruits, whole grains, low-fat dairy products, and lean protein.  Do not eat a lot of foods high in solid fats, added sugars, or salt.  Maintain a Healthy Weight Regular exercise can help you achieve or maintain a healthy weight. You should:  Do at least 150 minutes of exercise each week. The exercise should increase your heart rate and make you sweat (moderate-intensity exercise).  Do strength-training exercises at least twice a week.  Watch Your Levels of Cholesterol and Blood Lipids  Have your blood tested for lipids and cholesterol every 5 years starting at 55 years of age. If you are at high risk for heart disease, you  should start having your blood tested when you are 55 years old. You may need to have your cholesterol levels checked more often if: ? Your lipid or cholesterol levels are high. ? You are older than 55 years of age. ? You are at high risk for heart disease.  What should I know about cancer screening? Many types of cancers can be detected early and may often be prevented. Lung Cancer  You should be screened every year for lung cancer if: ? You are a current smoker who has smoked for at least 30 years. ? You are a former smoker who has quit within the past 15 years.  Talk to your health care provider about your screening options, when you should start screening, and how often you should be screened.  Colorectal Cancer  Routine colorectal cancer screening usually begins at 55 years of age and should be repeated every 5-10 years until you are 55 years old. You may need to be screened more often if early forms of precancerous polyps or small growths are found. Your health care provider may recommend screening at an earlier age if you have risk factors for colon cancer.  Your health care provider may recommend using home test kits to check for hidden blood in the stool.  A small camera at the end of a tube can be used to examine your colon (sigmoidoscopy or colonoscopy). This checks for the earliest forms of colorectal cancer.  Prostate and Testicular Cancer  Depending   on your age and overall health, your health care provider may do certain tests to screen for prostate and testicular cancer.  Talk to your health care provider about any symptoms or concerns you have about testicular or prostate cancer.  Skin Cancer  Check your skin from head to toe regularly.  Tell your health care provider about any new moles or changes in moles, especially if: ? There is a change in a mole's size, shape, or color. ? You have a mole that is larger than a pencil eraser.  Always use sunscreen. Apply  sunscreen liberally and repeat throughout the day.  Protect yourself by wearing long sleeves, pants, a wide-brimmed hat, and sunglasses when outside.  What should I know about heart disease, diabetes, and high blood pressure?  If you are 18-39 years of age, have your blood pressure checked every 3-5 years. If you are 40 years of age or older, have your blood pressure checked every year. You should have your blood pressure measured twice-once when you are at a hospital or clinic, and once when you are not at a hospital or clinic. Record the average of the two measurements. To check your blood pressure when you are not at a hospital or clinic, you can use: ? An automated blood pressure machine at a pharmacy. ? A home blood pressure monitor.  Talk to your health care provider about your target blood pressure.  If you are between 45-79 years old, ask your health care provider if you should take aspirin to prevent heart disease.  Have regular diabetes screenings by checking your fasting blood sugar level. ? If you are at a normal weight and have a low risk for diabetes, have this test once every three years after the age of 45. ? If you are overweight and have a high risk for diabetes, consider being tested at a younger age or more often.  A one-time screening for abdominal aortic aneurysm (AAA) by ultrasound is recommended for men aged 65-75 years who are current or former smokers. What should I know about preventing infection? Hepatitis B If you have a higher risk for hepatitis B, you should be screened for this virus. Talk with your health care provider to find out if you are at risk for hepatitis B infection. Hepatitis C Blood testing is recommended for:  Everyone born from 1945 through 1965.  Anyone with known risk factors for hepatitis C.  Sexually Transmitted Diseases (STDs)  You should be screened each year for STDs including gonorrhea and chlamydia if: ? You are sexually active  and are younger than 55 years of age. ? You are older than 55 years of age and your health care provider tells you that you are at risk for this type of infection. ? Your sexual activity has changed since you were last screened and you are at an increased risk for chlamydia or gonorrhea. Ask your health care provider if you are at risk.  Talk with your health care provider about whether you are at high risk of being infected with HIV. Your health care provider may recommend a prescription medicine to help prevent HIV infection.  What else can I do?  Schedule regular health, dental, and eye exams.  Stay current with your vaccines (immunizations).  Do not use any tobacco products, such as cigarettes, chewing tobacco, and e-cigarettes. If you need help quitting, ask your health care provider.  Limit alcohol intake to no more than 2 drinks per day. One drink equals   12 ounces of beer, 5 ounces of wine, or 1 ounces of hard liquor.  Do not use street drugs.  Do not share needles.  Ask your health care provider for help if you need support or information about quitting drugs.  Tell your health care provider if you often feel depressed.  Tell your health care provider if you have ever been abused or do not feel safe at home. This information is not intended to replace advice given to you by yo Mantenimiento de la salud en los hombres (Health Maintenance, Male) Un estilo de vida saludable y los cuidados preventivos son importantes para la salud y Counsellorel bienestar. Pregntele al mdico cul es el cronograma de exmenes peridicos adecuado para usted. QU DEBO SABER SOBRE EL PESO Y LA DIETA? Consuma una dieta saludable  Coma muchas verduras, frutas, cereales integrales, productos lcteos con bajo contenido de grasa y Associate Professorprotenas magras.  No consuma muchos alimentos de alto contenido de grasas slidas, azcares agregados o sal. Mantenga un peso saludable La actividad fsica habitual puede ayudarlo  a Baristaalcanzar o mantener un peso saludable. Deber hacer lo siguiente:  Realizar al menos 150minutos de actividad fsica por semana. El ejercicio debe aumentar la frecuencia cardaca y Development worker, international aidprovocar la transpiracin (ejercicio de Mexicointensidad moderada).  Hacer ejercicios de entrenamiento de fuerza por lo Rite Aidmenos dos veces por semana. Controlarse los niveles de colesterol y lpidos en la sangre  Hgase anlisis de sangre para controlar los lpidos y el colesterol cada 5aos a partir de los 35aos. Si tiene un riesgo alto de Warehouse managertener cardiopatas coronarias, debe comenzar a Assuranthacerse anlisis de [place]angre a los Rafael Gonzalez20aos. Es posible que Insurance underwriternecesite controlar los niveles de colesterol con mayor frecuencia si: ? Sus niveles de lpidos y colesterol son altos. ? Es mayor de [id number]. ? Tiene un riesgo alto de tener cardiopatas coronarias. QU DEBO SABER SOBRE LAS PRUEBAS DE DETECCIN DEL CNCER? Muchos tipos de cncer se pueden detectar de manera temprana y a menudo prevenirse. Cncer de pulmn  Debe someterse a pruebas de deteccin de cncer de pulmn todos los aos en los siguientes casos: ? Si fuma actualmente y lo ha hecho durante por lo menos 30aos. ? Si fue fumador que dej el hbito en el trmino de los ltimos 15aos.  Hable con el mdico sobre las opciones en relacin con los estudios de deteccin, cundo debe comenzar a Actuaryhacrselos y con Engineer, structuralqu frecuencia. Cncer colorrectal  Generalmente, las pruebas de deteccin habituales del cncer colorrectal comienzan a los 50aos y deben repetirse cada 5 a 10aos hasta los 75aos. Es posible que tenga que hacerse las pruebas con mayor frecuencia si se detectan formas tempranas de plipos precancerosos o pequeos bultos. Sin embargo, el mdico podr aconsejarle que lo haga antes, si tiene factores de riesgo para el cncer de colon.  El mdico puede recomendarle que use kits de prueba caseros para Recruitment consultanthallar sangre oculta en la materia fecal.  Se puede usar una pequea  cmara en el extremo de un tubo para examinar el colon (sigmoidoscopia o colonoscopia). Este estudio PPG Industriesdetecta las formas ms tempranas de Building services engineercncer colorrectal. Cncer de prstata y de testculo  En funcin de la edad y del [place] Browestado de salud general, el mdico puede realizarle determinados estudios de deteccin del cncer de prstata y de testculo.  Hable con el mdico sobre cualquier sntoma o acerca de las inquietudes que tenga sobre el cncer de prstata o de testculo. Cncer de piel  Revise la piel de la cabeza a los pies con  regularidad.  Informe al mdico si aparecen nuevos lunares o si nota cambios en los que ya tiene, especialmente en estos casos: ? Si hay un cambio en el tamao, la forma o el color del lunar. ? Si tiene un lunar que es ms grande que el tamao de una goma de Paramedic.  Siempre use pantalla solar. Aplquese pantalla solar de [place] y repetida a lo largo del Futures trader.  Use mangas y Automatic Data, un sombrero de ala ancha y gafas para el sol cuando est al Guadalupe Dawn, para protegerse. QU DEBO SABER SOBRE LAS CARDIOPATAS CORONARIAS, LA DIABETES Y LA HIPERTENSIN ARTERIAL?  Si usted tiene entre 18 y 39aos, debe medirse la presin arterial cada 3a 5aos. Si usted tiene 40aos o ms, debe medirse la presin arterial [employer]. Debe medirse la presin arterial dos veces: una vez cuando est en un hospital o una clnica y la otra vez cuando est en otro sitio. Registre el promedio de [employer]. Para controlar su presin arterial cuando no est en un hospital o Paulita Cradle, puede usar lo siguiente: ? Valere Dross automtica para medir la presin arterial en una farmacia. ? Un monitor para medir la presin arterial en el hogar.  Hable con el mdico [employer] ideales de la presin arterial.  Si tiene entre 45 y 79aos, consltele al mdico si debe tomar aspirina para evitar las cardiopatas coronarias.  Hgase anlisis habituales de deteccin de  la diabetes; para ello, contrlese la glucemia en ayunas. ? Si su peso es normal y tiene un bajo riesgo de padecer diabetes, realcese este anlisis cada tres aos despus de los 45aos. ? Si tiene sobrepeso y un alto riesgo de padecer diabetes, considere someterse a este anlisis antes o con mayor frecuencia.  Para los hombres que tienen entre 65 y 18aos, y son o han sido fumadores, se recomienda un nico estudio con ecografa para Engineer, manufacturing un aneurisma de aorta abdominal (AAA). QU DEBO SABER SOBRE LA PREVENCIN DE LAS INFECCIONES? HepatitisB Si tiene un riesgo ms alto de Primary school teacher hepatitis B, debe someterse a un examen de deteccin de este virus. Hable con el mdico para determinar si corre riesgo de tener una infeccin por hepatitisB. Hepatitis C Se recomienda un anlisis de [place] para:  Todos los que nacieron entre 1945 y [phone].  Todas las personas que tengan un riesgo de haber contrado hepatitis C. Enfermedades de transmisin sexual (ETS)  Debe realizarse pruebas de Airline pilot de las ETS todos los aos, incluidas la gonorrea y la clamidia, en estos casos: ? Es sexualmente activo y es menor de [place]. ? Es mayor de 24aos, y Public affairs consultant informa que corre riesgo de tener este tipo de infecciones. ? La actividad sexual ha cambiado desde que le hicieron la ltima prueba de deteccin y tiene un riesgo mayor de Warehouse manager clamidia o Copy. Pregntele al mdico si usted tiene riesgo.  Consulte a su mdico para saber si tiene un alto riesgo de infectarse por el VIH. El mdico puede recomendarle un medicamento de venta con receta para ayudar a evitar la infeccin por el VIH. QU MS PUEDO HACER?  Realcese los estudios de rutina de la salud, dentales y de Wellsite geologist.  Mantngase al da con las vacunas (inmunizaciones).  No consuma ningn producto que contenga tabaco, lo que incluye cigarrillos, tabaco de Theatre manager y Administrator, Civil Service. Si necesita ayuda para dejar de fumar,  consulte al mdico.  Limite el consumo de alcohol a no ms de  por da. [employer] a 12 onzas de cerveza, 5onzas de vino o 1onzas de bebidas alcohlicas de alta graduacin.  No consuma drogas.  No comparta agujas.  Solicite ayuda a su mdico si necesita apoyo o informacin para abandonar las drogas.  Informe a su mdico si a menudo se siente deprimido.  Notifique a su mdico si alguna vez ha sido vctima de abuso o si no se siente seguro en su hogar. Esta informacin no tiene Theme park manager el consejo del mdico. Asegrese de hacerle al mdico cualquier pregunta que tenga. Document Released: 07/07/2007 Document Revised: 01/29/2014 Document Reviewed: 10/12/2014 Elsevier Interactive Patient Education  2018 [employer]. ur health care provider. Make sure you discuss any questions you have with your health care provider. Document Released: 07/07/2007 Document Revised: 09/07/2015 Document Reviewed: 10/12/2014 Elsevier Interactive Patient Education  [employer].

## 2017-10-14 NOTE — Progress Notes (Signed)
BP Readings from Last 3 Encounters:  10/14/17 112/73  03/12/17 122/72  02/25/17 108/66   Wt Readings from Last 3 Encounters:  10/14/17 171 lb 6.4 oz (77.7 kg)  03/12/17 179 lb (81.2 kg)  02/25/17 179 lb 12.8 oz (81.6 kg)   Kenneth Wilkerson 57 y.o.   Chief Complaint  Patient presents with  . Annual Exam  . Medication Refill    lisinopril and ibuprofen    HISTORY OF PRESENT ILLNESS: This is a 57 y.o. male with history of hypertension here for follow-up and annual exam.  Needs medication refill. Also complaining of what he thinks are inguinal hernias.  Requesting surgical referral. Has no other complaints or medical concerns. Has a history of chronic back pain and ibuprofen 800 mg helps when needed.  Requesting refill.  HPI   Prior to Admission medications   Medication Sig Start Date End Date Taking? Authorizing Provider  ibuprofen (ADVIL,MOTRIN) 800 MG tablet Take 1 tablet (800 mg total) by mouth every 6 (six) hours as needed. 01/24/16  Yes Kenneth Neas, PA-C  lisinopril (PRINIVIL,ZESTRIL) 20 MG tablet Take 1 tablet (20 mg total) by mouth daily. 03/12/17  Yes Kenneth Wilkerson, Kenneth Kempf, MD  ketoconazole (NIZORAL) 2 % cream Apply 1 application topically daily. 03/12/17   Kenneth Quint, MD  triamcinolone cream (KENALOG) 0.1 % Apply 1 application topically 2 (two) times daily. 03/12/17   Kenneth Quint, MD    Allergies  Allergen Reactions  . Amoxicillin Other (See Comments)    Shaking/ tremors  . Darvocet [Propoxyphene N-Acetaminophen] Other (See Comments)    HA, NERVOUSNESS  . Sulfa Antibiotics Other (See Comments)    Dizziness, shakey, sweats    Patient Active Problem List   Diagnosis Date Noted  . Essential hypertension 08/09/2014  . Erythrocytosis 04/07/2013  . Elevated bilirubin 04/07/2013  . Hypertension   . Hemorrhoids     Past Medical History:  Diagnosis Date  . Elevated bilirubin 04/07/2013  . Erythrocytosis 04/07/2013  . Hemorrhoids   .  Hyperlipidemia   . Hypertension   . Tuberculosis    Treated for in 2000  . Vitamin D deficiency     Past Surgical History:  Procedure Laterality Date  . HEMORRHOID SURGERY      Social History   Socioeconomic History  . Marital status: Legally Separated    Spouse name: Not on file  . Number of children: 5  . Years of education: 5th grade  . Highest education level: Not on file  Occupational History  . Occupation: Equities trader: [employer]  Social Needs  . Financial resource strain: Not on file  . Food insecurity:    Worry: Not on file    Inability: Not on file  . Transportation needs:    Medical: Not on file    Non-medical: Not on file  Tobacco Use  . Smoking status: Never Smoker  . Smokeless tobacco: Never Used  Substance and Sexual Activity  . Alcohol use: Yes    Alcohol/week: 3.0 - 5.0 standard drinks    Types: 3 Cans of beer per week    Comment: occ 3 times a week  . Drug use: No  . Sexual activity: Never  Lifestyle  . Physical activity:    Days per week: Not on file    Minutes per session: Not on file  . Stress: Not on file  Relationships  . Social connections:    Talks on phone: Not on file  Gets together: Not on file    Attends religious service: Not on file    Active member of club or organization: Not on file    Attends meetings of clubs or organizations: Not on file    Relationship status: Not on file  . Intimate partner violence:    Fear of current or ex partner: Not on file    Emotionally abused: Not on file    Physically abused: Not on file    Forced sexual activity: Not on file  Other Topics Concern  . Not on file  Social History Narrative   Lives with his girlfriend.  His wife (they are separated) and 5 children live in [place].   He is from [place], [place].  Arrived in the [place] in 1997.    No family history on file.   Review of Systems  Constitutional: Negative.  Negative for chills, fever and weight loss.    HENT: Negative.  Negative for hearing loss and sore throat.   Eyes: Negative.  Negative for blurred vision and double vision.  Respiratory: Negative.  Negative for cough and shortness of breath.   Cardiovascular: Negative.  Negative for chest pain and palpitations.  Gastrointestinal: Negative.  Negative for abdominal pain, blood in stool, diarrhea, melena, nausea and vomiting.  Genitourinary: Negative.  Negative for dysuria and hematuria.  Musculoskeletal: Positive for back pain. Negative for myalgias.       Chronic groin pain  Skin: Negative.  Negative for rash.  Neurological: Negative.  Negative for dizziness, focal weakness and headaches.  Endo/Heme/Allergies: Negative.   All other systems reviewed and are negative.  Vitals:   10/14/17 0950  BP: 112/73  Pulse: 75  Resp: 16  Temp: 98.4 F (36.9 C)  SpO2: 97%     Physical Exam  Constitutional: He appears well-developed and well-nourished.  HENT:  Head: Normocephalic and atraumatic.  Nose: Nose normal.  Mouth/Throat: Oropharynx is clear and moist.  Eyes: Pupils are equal, round, and reactive to light. Conjunctivae and EOM are normal.  Neck: Normal range of motion. Neck supple. No JVD present.  Cardiovascular: Normal rate, regular rhythm, normal heart sounds and intact distal pulses.  Pulmonary/Chest: Effort normal and breath sounds normal.  Abdominal: Soft. Bowel sounds are normal. He exhibits no distension and no mass. There is no tenderness. A hernia is present. Hernia confirmed positive in the right inguinal area and confirmed positive in the left inguinal area.  Genitourinary: Testes normal. Uncircumcised.  Musculoskeletal: Normal range of motion. He exhibits no edema or tenderness.  Lymphadenopathy:    He has no cervical adenopathy. No inguinal adenopathy noted on the right or left side.  Neurological: He is alert. No sensory deficit. He exhibits normal muscle tone. Coordination normal.  Skin: Skin is warm and dry.  Capillary refill takes less than 2 seconds. No rash noted.  Psychiatric: He has a normal mood and affect. His behavior is normal.  Vitals reviewed.    ASSESSMENT & PLAN: Kenneth Wilkerson was seen today for annual exam and medication refill.  Diagnoses and all orders for this visit:  Routine general medical examination at a health care facility -     CBC with Differential -     Comprehensive metabolic panel -     Hemoglobin A1c -     Lipid panel -     HIV antibody  Need for prophylactic vaccination and inoculation against influenza -     Flu Vaccine QUAD 36+ mos IM  Bilateral inguinal hernia without obstruction  or gangrene, recurrence not specified -     Ambulatory referral to General Surgery  Essential hypertension -     lisinopril (PRINIVIL,ZESTRIL) 20 MG tablet; Take 1 tablet (20 mg total) by mouth daily.  Medication refill -     ibuprofen (ADVIL,MOTRIN) 800 MG tablet; Take 1 tablet (800 mg total) by mouth every 6 (six) hours as needed.    Patient Instructions       If you have lab work done today you will be contacted with your lab results within the next 2 weeks.  If you have not heard from [place] then please contact us. The fastest way to get your results is to register for My Chart.   IF you received an x-ray today, you will receive an invoice from [facility]. Please contact [facility] at [phone] with questions or concerns regarding your invoice.   IF you received labwork today, you will receive an invoice from [facility]. Please contact [facility] at [phone] with questions or concerns regarding your invoice.   Our billing staff will not be able to assist you with questions regarding bills from these companies.  You will be contacted with the lab results as soon as they are available. The fastest way to get your results is to activate your My Chart account. Instructions are located on the last page of this paperwork. If you have not heard from [place]  regarding the results in 2 weeks, please contact this office.      Health Maintenance, Male A healthy lifestyle and preventive care is important for your health and wellness. Ask your health care provider about what schedule of regular examinations is right for you. What should I know about weight and diet? Eat a Healthy Diet  Eat plenty of vegetables, fruits, whole grains, low-fat dairy products, and lean protein.  Do not eat a lot of foods high in solid fats, added sugars, or salt.  Maintain a Healthy Weight Regular exercise can help you achieve or maintain a healthy weight. You should:  Do at least 150 minutes of exercise each week. The exercise should increase your heart rate and make you sweat (moderate-intensity exercise).  Do strength-training exercises at least twice a week.  Watch Your Levels of Cholesterol and Blood Lipids  Have your blood tested for lipids and cholesterol every 5 years starting at 55 years of age. If you are at high risk for heart disease, you should start having your blood tested when you are 55 years old. You may need to have your cholesterol levels checked more often if: ? Your lipid or cholesterol levels are high. ? You are older than 55 years of age. ? You are at high risk for heart disease.  What should I know about cancer screening? Many types of cancers can be detected early and may often be prevented. Lung Cancer  You should be screened every year for lung cancer if: ? You are a current smoker who has smoked for at least 30 years. ? You are a former smoker who has quit within the past 15 years.  Talk to your health care provider about your screening options, when you should start screening, and how often you should be screened.  Colorectal Cancer  Routine colorectal cancer screening usually begins at 55 years of age and should be repeated every 5-10 years until you are 55 years old. You may need to be screened more often if early forms of  precancerous polyps or small growths are found. Your  health care provider may recommend screening at an earlier age if you have risk factors for colon cancer.  Your health care provider may recommend using home test kits to check for hidden blood in the stool.  A small camera at the end of a tube can be used to examine your colon (sigmoidoscopy or colonoscopy). This checks for the earliest forms of colorectal cancer.  Prostate and Testicular Cancer  Depending on your age and overall health, your health care provider may do certain tests to screen for prostate and testicular cancer.  Talk to your health care provider about any symptoms or concerns you have about testicular or prostate cancer.  Skin Cancer  Check your skin from head to toe regularly.  Tell your health care provider about any new moles or changes in moles, especially if: ? There is a change in a mole's size, shape, or color. ? You have a mole that is larger than a pencil eraser.  Always use sunscreen. Apply sunscreen liberally and repeat throughout the day.  Protect yourself by wearing long sleeves, pants, a wide-brimmed hat, and sunglasses when outside.  What should I know about heart disease, diabetes, and high blood pressure?  If you are 40-71 years of age, have your blood pressure checked every 3-5 years. If you are 18 years of age or older, have your blood pressure checked every year. You should have your blood pressure measured twice-once when you are at a hospital or clinic, and once when you are not at a hospital or clinic. Record the average of the two measurements. To check your blood pressure when you are not at a hospital or clinic, you can use: ? An automated blood pressure machine at a pharmacy. ? A home blood pressure monitor.  Talk to your health care provider about your target blood pressure.  If you are between 49-42 years old, ask your health care provider if you should take aspirin to prevent heart  disease.  Have regular diabetes screenings by checking your fasting blood sugar level. ? If you are at a normal weight and have a low risk for diabetes, have this test once every three years after the age of 68. ? If you are overweight and have a high risk for diabetes, consider being tested at a younger age or more often.  A one-time screening for abdominal aortic aneurysm (AAA) by ultrasound is recommended for men aged 65-75 years who are current or former smokers. What should I know about preventing infection? Hepatitis B If you have a higher risk for hepatitis B, you should be screened for this virus. Talk with your health care provider to find out if you are at risk for hepatitis B infection. Hepatitis C Blood testing is recommended for:  Everyone born from [birth year] through 1963.  Anyone with known risk factors for hepatitis C.  Sexually Transmitted Diseases (STDs)  You should be screened each year for STDs including gonorrhea and chlamydia if: ? You are sexually active and are younger than 55 years of age. ? You are older than 55 years of age and your health care provider tells you that you are at risk for this type of infection. ? Your sexual activity has changed since you were last screened and you are at an increased risk for chlamydia or gonorrhea. Ask your health care provider if you are at risk.  Talk with your health care provider about whether you are at high risk of being infected with HIV. Your health  care provider may recommend a prescription medicine to help prevent HIV infection.  What else can I do?  Schedule regular health, dental, and eye exams.  Stay current with your vaccines (immunizations).  Do not use any tobacco products, such as cigarettes, chewing tobacco, and e-cigarettes. If you need help quitting, ask your health care provider.  Limit alcohol intake to no more than 2 drinks per day. One drink equals 12 ounces of beer, 5 ounces of wine, or 1 ounces of  hard liquor.  Do not use street drugs.  Do not share needles.  Ask your health care provider for help if you need support or information about quitting drugs.  Tell your health care provider if you often feel depressed.  Tell your health care provider if you have ever been abused or do not feel safe at home. This information is not intended to replace advice given to you by yo Mantenimiento de la salud en los hombres (Health Maintenance, Male) Un estilo de vida saludable y los cuidados preventivos son importantes para la salud y Counsellorel bienestar. Pregntele al mdico cul es el cronograma de exmenes peridicos adecuado para usted. QU DEBO SABER SOBRE EL PESO Y LA DIETA? Consuma una dieta saludable  Coma muchas verduras, frutas, cereales integrales, productos lcteos con bajo contenido de grasa y Associate Professorprotenas magras.  No consuma muchos alimentos de alto contenido de grasas slidas, azcares agregados o sal. Mantenga un peso saludable La actividad fsica habitual puede ayudarlo a Baristaalcanzar o mantener un peso saludable. Deber hacer lo siguiente:  Realizar al menos 150minutos de actividad fsica por semana. El ejercicio debe aumentar la frecuencia cardaca y Development worker, international aidprovocar la transpiracin (ejercicio de Harper Woodsintensidad moderada).  Hacer ejercicios de entrenamiento de fuerza por lo Rite Aidmenos dos veces por semana. Controlarse los niveles de colesterol y lpidos en la sangre  Hgase anlisis de sangre para controlar los lpidos y el colesterol cada 5aos a partir de los 35aos. Si tiene un riesgo alto de Warehouse managertener cardiopatas coronarias, debe comenzar a Assuranthacerse anlisis de Citrus Heightssangre a los [place]20aos. Es posible que Insurance underwriternecesite controlar los niveles de colesterol con mayor frecuencia si: ? Sus niveles de lpidos y colesterol son altos. ? Es mayor de [id number]. ? Tiene un riesgo alto de tener cardiopatas coronarias. QU DEBO SABER SOBRE LAS PRUEBAS DE DETECCIN DEL CNCER? Muchos tipos de cncer se pueden detectar de  manera temprana y a menudo prevenirse. Cncer de pulmn  Debe someterse a pruebas de deteccin de cncer de pulmn todos los aos en los siguientes casos: ? Si fuma actualmente y lo ha hecho durante por lo menos 30aos. ? Si fue fumador que dej el hbito en el trmino de los ltimos 15aos.  Hable con el mdico sobre las opciones en relacin con los estudios de deteccin, cundo debe comenzar a Actuaryhacrselos y con Engineer, structuralqu frecuencia. Cncer colorrectal  Generalmente, las pruebas de deteccin habituales del cncer colorrectal comienzan a los 50aos y deben repetirse cada 5 a 10aos hasta los 75aos. Es posible que tenga que hacerse las pruebas con mayor frecuencia si se detectan formas tempranas de plipos precancerosos o pequeos bultos. Sin embargo, el mdico podr aconsejarle que lo haga antes, si tiene factores de riesgo para el cncer de colon.  El mdico puede recomendarle que use kits de prueba caseros para Recruitment consultanthallar sangre oculta en la materia fecal.  Se puede usar una pequea cmara en el extremo de un tubo para examinar el colon (sigmoidoscopia o colonoscopia). Este estudio PPG Industriesdetecta las formas ms tempranas de  cncer colorrectal. Cncer de prstata y de testculo  En funcin de la edad y del [place] de salud general, el mdico puede realizarle determinados estudios de deteccin del cncer de prstata y de testculo.  Hable con el mdico sobre cualquier sntoma o acerca de las inquietudes que tenga sobre el cncer de prstata o de testculo. Cncer de piel  Revise la piel de la cabeza a los pies con regularidad.  Informe al mdico si aparecen nuevos lunares o si nota cambios en los que ya tiene, especialmente en estos casos: ? Si hay un cambio en el tamao, la forma o el color del lunar. ? Si tiene un lunar que es ms grande que el tamao de una goma de Paramedic.  Siempre use pantalla solar. Aplquese pantalla solar de Barth Kirks y repetida a lo largo del Futures trader.  Use mangas y  Automatic Data, un sombrero de ala ancha y gafas para el sol cuando est al Gu[place]upe Dawn, para protegerse. QU DEBO SABER SOBRE LAS CARDIOPATAS CORONARIAS, LA DIABETES Y LA HIPERTENSIN ARTERIAL?  Si usted tiene entre 18 y 39aos, debe medirse la presin arterial cada 3a 5aos. Si usted tiene 40aos o ms, debe medirse la presin arterial [employer]. Debe medirse la presin arterial dos veces: una vez cuando est en un hospital o una clnica y la otra vez cuando est en otro sitio. Registre el promedio de [employer]. Para controlar su presin arterial cuando no est en un hospital o Paulita Cradle, puede usar lo siguiente: ? Valere Dross automtica para medir la presin arterial en una farmacia. ? Un monitor para medir la presin arterial en el hogar.  Hable con el mdico [employer] ideales de la presin arterial.  Si tiene entre 45 y 79aos, consltele al mdico si debe tomar aspirina para evitar las cardiopatas coronarias.  Hgase anlisis habituales de deteccin de la diabetes; para ello, contrlese la glucemia en ayunas. ? Si su peso es normal y tiene un bajo riesgo de padecer diabetes, realcese este anlisis cada tres aos despus de los 45aos. ? Si tiene sobrepeso y un alto riesgo de padecer diabetes, considere someterse a este anlisis antes o con mayor frecuencia.  Para los hombres que tienen entre 65 y 85aos, y son o han sido fumadores, se recomienda un nico estudio con ecografa para Engineer, manufacturing un aneurisma de aorta abdominal (AAA). QU DEBO SABER SOBRE LA PREVENCIN DE LAS INFECCIONES? HepatitisB Si tiene un riesgo ms alto de Primary school teacher hepatitis B, debe someterse a un examen de deteccin de este virus. Hable con el mdico para determinar si corre riesgo de tener una infeccin por hepatitisB. Hepatitis C Se recomienda un anlisis de [place] para:  Todos los que nacieron entre 1943 y [phone].  Todas las personas que tengan un riesgo de haber contrado  hepatitis C. Enfermedades de transmisin sexual (ETS)  Debe realizarse pruebas de Airline pilot de las ETS todos los aos, incluidas la gonorrea y la clamidia, en estos casos: ? Es sexualmente activo y es menor de [place]. ? Es mayor de 24aos, y Public affairs consultant informa que corre riesgo de tener este tipo de infecciones. ? La actividad sexual ha cambiado desde que le hicieron la ltima prueba de deteccin y tiene un riesgo mayor de Warehouse manager clamidia o Copy. Pregntele al mdico si usted tiene riesgo.  Consulte a su mdico para saber si tiene un alto riesgo de infectarse por el VIH. El mdico puede recomendarle un medicamento de venta con receta para  ayudar a evitar la infeccin por el VIH. QU MS PUEDO HACER?  Realcese los estudios de rutina de la salud, dentales y de Wellsite geologist.  Mantngase al da con las vacunas (inmunizaciones).  No consuma ningn producto que contenga tabaco, lo que incluye cigarrillos, tabaco de Theatre manager y Administrator, Civil Service. Si necesita ayuda para dejar de fumar, consulte al mdico.  Limite el consumo de alcohol a no ms de por da. [employer] a 12 onzas de cerveza, 5onzas de vino o 1onzas de bebidas alcohlicas de alta graduacin.  No consuma drogas.  No comparta agujas.  Solicite ayuda a su mdico si necesita apoyo o informacin para abandonar las drogas.  Informe a su mdico si a menudo se siente deprimido.  Notifique a su mdico si alguna vez ha sido vctima de abuso o si no se siente seguro en su hogar. Esta informacin no tiene Theme park manager el consejo del mdico. Asegrese de hacerle al mdico cualquier pregunta que tenga. Document Released: 07/07/2007 Document Revised: 01/29/2014 Document Reviewed: 10/12/2014 Elsevier Interactive Patient Education  2018 [employer]. ur health care provider. Make sure you discuss any questions you have with your health care provider. Document Released: 07/07/2007 Document Revised: 09/07/2015  Document Reviewed: 10/12/2014 Elsevier Interactive Patient Education  2018 Elsevier Inc.      Edwina Barth, MD [facility]

## 2017-10-15 ENCOUNTER — Encounter: Payer: Self-pay | Admitting: *Deleted

## 2017-10-15 LAB — CBC WITH DIFFERENTIAL/PLATELET
BASOS ABS: 0 10*3/uL (ref 0.0–0.2)
Basos: 0 %
EOS (ABSOLUTE): 0.1 10*3/uL (ref 0.0–0.4)
Eos: 1 %
Hematocrit: 49.3 % (ref 37.5–51.0)
Hemoglobin: 17.2 g/dL (ref 13.0–17.7)
Immature Grans (Abs): 0 10*3/uL (ref 0.0–0.1)
Immature Granulocytes: 0 %
LYMPHS ABS: 2.5 10*3/uL (ref 0.7–3.1)
Lymphs: 40 %
MCH: 31.9 pg (ref 26.6–33.0)
MCHC: 34.9 g/dL (ref 31.5–35.7)
MCV: 91 fL (ref 79–97)
MONOS ABS: 0.6 10*3/uL (ref 0.1–0.9)
Monocytes: 9 %
NEUTROS ABS: 3.1 10*3/uL (ref 1.4–7.0)
Neutrophils: 50 %
Platelets: 224 10*3/uL (ref 150–450)
RBC: 5.4 x10E6/uL (ref 4.14–5.80)
RDW: 12.6 % (ref 12.3–15.4)
WBC: 6.3 10*3/uL (ref 3.4–10.8)

## 2017-10-15 LAB — HEMOGLOBIN A1C
ESTIMATED AVERAGE GLUCOSE: 94 mg/dL
HEMOGLOBIN A1C: 4.9 % (ref 4.8–5.6)

## 2017-10-15 LAB — LIPID PANEL
CHOL/HDL RATIO: 4.1 ratio (ref 0.0–5.0)
Cholesterol, Total: 159 mg/dL (ref 100–199)
HDL: 39 mg/dL — AB (ref >39)
LDL Calculated: 104 mg/dL — ABNORMAL HIGH (ref 0–99)
Triglycerides: 78 mg/dL (ref 0–149)
VLDL Cholesterol Cal: 16 mg/dL (ref 5–40)

## 2017-10-15 LAB — COMPREHENSIVE METABOLIC PANEL
ALK PHOS: 82 IU/L (ref 39–117)
ALT: 28 IU/L (ref 0–44)
AST: 26 IU/L (ref 0–40)
Albumin/Globulin Ratio: 1.3 (ref 1.2–2.2)
Albumin: 4.5 g/dL (ref 3.5–5.5)
BILIRUBIN TOTAL: 1.7 mg/dL — AB (ref 0.0–1.2)
BUN / CREAT RATIO: 19 (ref 9–20)
BUN: 13 mg/dL (ref 6–24)
CHLORIDE: 98 mmol/L (ref 96–106)
CO2: 23 mmol/L (ref 20–29)
Calcium: 9.4 mg/dL (ref 8.7–10.2)
Creatinine, Ser: 0.69 mg/dL — ABNORMAL LOW (ref 0.76–1.27)
GFR calc Af Amer: 124 mL/min/{1.73_m2} (ref >59)
GFR calc non Af Amer: 107 mL/min/{1.73_m2} (ref >59)
GLOBULIN, TOTAL: 3.4 g/dL (ref 1.5–4.5)
GLUCOSE: 92 mg/dL (ref 65–99)
Potassium: 4.2 mmol/L (ref 3.5–5.2)
SODIUM: 134 mmol/L (ref 134–144)
Total Protein: 7.9 g/dL (ref 6.0–8.5)

## 2017-10-15 LAB — HIV ANTIBODY (ROUTINE TESTING W REFLEX): HIV SCREEN 4TH GENERATION: NONREACTIVE

## 2018-04-16 ENCOUNTER — Telehealth: Payer: Self-pay | Admitting: *Deleted

## 2018-04-16 ENCOUNTER — Telehealth: Payer: BLUE CROSS/BLUE SHIELD | Admitting: Emergency Medicine

## 2018-04-16 ENCOUNTER — Other Ambulatory Visit: Payer: Self-pay

## 2018-04-16 NOTE — Telephone Encounter (Signed)
Called patient's mobile number left voice mail he missed his Telemed call. Call the office to reschedule.

## 2018-12-23 HISTORY — PX: HERNIA REPAIR: SHX51

## 2019-01-19 ENCOUNTER — Other Ambulatory Visit: Payer: Self-pay | Admitting: Emergency Medicine

## 2019-01-19 DIAGNOSIS — I10 Essential (primary) hypertension: Secondary | ICD-10-CM

## 2019-01-19 NOTE — Telephone Encounter (Signed)
Requested medication (s) are due for refill today: yes  Requested medication (s) are on the active medication list: yes  Last refill:  10/14/2018  Future visit scheduled: no  Notes to clinic:  no valid encounter within last 6 months   Requested Prescriptions  Pending Prescriptions Disp Refills   lisinopril (ZESTRIL) 20 MG tablet [Pharmacy Med Name: Lisinopril 20 MG Oral Tablet] 90 tablet 0    Sig: Take 1 tablet by mouth once daily      Cardiovascular:  ACE Inhibitors Failed - 01/19/2019  9:12 AM      Failed - Cr in normal range and within 180 days    Creatinine  Date Value Ref Range Status  04/20/2013 0.8 0.7 - 1.3 mg/dL Final   Creat  Date Value Ref Range Status  08/16/2015 0.73 0.70 - 1.33 mg/dL Final    Comment:      For patients > or = 56 years of age: The upper reference limit for Creatinine is approximately 13% higher for people identified as African-American.     08/16/2015 0.73 0.70 - 1.33 mg/dL Final    Comment:      For patients > or = 56 years of age: The upper reference limit for Creatinine is approximately 13% higher for people identified as African-American.      Creatinine, Ser  Date Value Ref Range Status  10/14/2017 0.69 (L) 0.76 - 1.27 mg/dL Final          Failed - K in normal range and within 180 days    Potassium  Date Value Ref Range Status  10/14/2017 4.2 3.5 - 5.2 mmol/L Final  04/20/2013 4.0 3.5 - 5.1 mEq/L Final          Failed - Valid encounter within last 6 months    Recent Outpatient Visits           1 year ago Routine general medical examination at a health care facility   [facility], Ines Bloomer, MD   1 year ago Rash and nonspecific skin eruption   [facility], Ines Bloomer, MD   1 year ago Rash and nonspecific skin eruption   [facility], Ines Bloomer, MD   2 years ago Essential hypertension   [facility] at [place], [place]   2 years ago Infective  otitis externa of left ear   [facility], Audrie Lia, PA-C              Passed - Patient is not pregnant      Passed - Last BP in normal range    BP Readings from Last 1 Encounters:  10/14/17 112/73

## 2019-01-19 NOTE — Telephone Encounter (Signed)
No refills without office visit or tele-med visit

## 2019-02-25 ENCOUNTER — Encounter (HOSPITAL_COMMUNITY): Payer: Self-pay | Admitting: Emergency Medicine

## 2019-02-25 ENCOUNTER — Other Ambulatory Visit: Payer: Self-pay

## 2019-02-25 ENCOUNTER — Ambulatory Visit (HOSPITAL_COMMUNITY)
Admission: EM | Admit: 2019-02-25 | Discharge: 2019-02-25 | Disposition: A | Discharge status: Home / Self Care | Payer: 59 | Attending: Urgent Care | Admitting: Urgent Care

## 2019-02-25 DIAGNOSIS — I10 Essential (primary) hypertension: Secondary | ICD-10-CM

## 2019-02-25 DIAGNOSIS — R030 Elevated blood-pressure reading, without diagnosis of hypertension: Secondary | ICD-10-CM

## 2019-02-25 DIAGNOSIS — Z760 Encounter for issue of repeat prescription: Secondary | ICD-10-CM

## 2019-02-25 MED ORDER — LISINOPRIL 20 MG PO TABS: 20.0000 mg | ORAL_TABLET | Freq: Every day | ORAL | 0 refills | Status: DC

## 2019-02-25 NOTE — ED Triage Notes (Addendum)
Pt here for a refill on his Lisinopril.  Pt states he has an appointment with a new PCP on February 18.  Pt has been out of his medication x1 week. Pt denies any issues, no h/a, SOB, CP, dizziness, syncope, or numbness or tingling in his arms or leg.

## 2019-02-25 NOTE — ED Provider Notes (Signed)
[facility]   MRN: [id number] DOB: 06-14-1960  Subjective:   Kenneth Wilkerson is a 59 y.o. male presenting for med refill. He has an appt soon with a new PCP. Takes lisinopril at 20mg  once daily. Tries to follow healthy diet.   No current facility-administered medications for this encounter.  Current Outpatient Medications:  .  lisinopril (PRINIVIL,ZESTRIL) 20 MG tablet, Take 1 tablet (20 mg total) by mouth daily., Disp: 90 tablet, Rfl: 3 .  ibuprofen (ADVIL,MOTRIN) 800 MG tablet, Take 1 tablet (800 mg total) by mouth every 6 (six) hours as needed., Disp: 30 tablet, Rfl: 1 .  ketoconazole (NIZORAL) 2 % cream, Apply 1 application topically daily., Disp: 15 g, Rfl: 0 .  triamcinolone cream (KENALOG) 0.1 %, Apply 1 application topically 2 (two) times daily., Disp: 30 g, Rfl: 0   Allergies  Allergen Reactions  . Amoxicillin Other (See Comments)    Shaking/ tremors  . Darvocet [Propoxyphene N-Acetaminophen] Other (See Comments)    HA, NERVOUSNESS  . Sulfa Antibiotics Other (See Comments)    Dizziness, shakey, sweats    Past Medical History:  Diagnosis Date  . Elevated bilirubin 04/07/2013  . Erythrocytosis 04/07/2013  . Hemorrhoids   . Hyperlipidemia   . Hypertension   . Tuberculosis    Treated for in 2000  . Vitamin D deficiency      Past Surgical History:  Procedure Laterality Date  . HEMORRHOID SURGERY      Family History  Family history unknown: Yes    Social History   Tobacco Use  . Smoking status: Never Smoker  . Smokeless tobacco: Never Used  Substance Use Topics  . Alcohol use: Yes    Alcohol/week: 3.0 - 5.0 standard drinks    Types: 3 Cans of beer per week    Comment: occ 3 times a week  . Drug use: No    Review of Systems  Constitutional: Negative for fever and malaise/fatigue.  HENT: Negative for congestion, ear pain, sinus pain and sore throat.   Eyes: Negative for blurred vision, double vision, discharge and redness.  Respiratory:  Negative for cough, hemoptysis, shortness of breath and wheezing.   Cardiovascular: Negative for chest pain.  Gastrointestinal: Negative for abdominal pain, diarrhea, nausea and vomiting.  Genitourinary: Negative for dysuria, flank pain and hematuria.  Musculoskeletal: Negative for myalgias.  Skin: Negative for rash.  Neurological: Negative for dizziness, tingling, sensory change, speech change, focal weakness, weakness and headaches.  Psychiatric/Behavioral: Negative for depression and substance abuse.     Objective:   Vitals: BP (!) 131/91 (BP Location: Right Arm) Comment: large cuff, recheck due to high reading the first time.  Pulse 86   Temp (!) 97.5 F (36.4 C) (Oral)   Resp 12   SpO2 99%   Physical Exam Constitutional:      General: He is not in acute distress.    Appearance: Normal appearance. He is well-developed. He is not ill-appearing, toxic-appearing or diaphoretic.  HENT:     Head: Normocephalic and atraumatic.     Right Ear: External ear normal.     Left Ear: External ear normal.     Nose: Nose normal.     Mouth/Throat:     Mouth: Mucous membranes are moist.     Pharynx: Oropharynx is clear.  Eyes:     General: No scleral icterus.    Extraocular Movements: Extraocular movements intact.     Pupils: Pupils are equal, round, and reactive to light.  Cardiovascular:  Rate and Rhythm: Normal rate and regular rhythm.     Heart sounds: Normal heart sounds. No murmur. No friction rub. No gallop.   Pulmonary:     Effort: Pulmonary effort is normal. No respiratory distress.     Breath sounds: Normal breath sounds. No stridor. No wheezing, rhonchi or rales.  Musculoskeletal:     Right lower leg: No edema.     Left lower leg: No edema.  Skin:    General: Skin is warm and dry.  Neurological:     Mental Status: He is alert and oriented to person, place, and time.     Cranial Nerves: No cranial nerve deficit.     Motor: No weakness.     Coordination: Coordination  normal.     Gait: Gait normal.     Deep Tendon Reflexes: Reflexes normal.  Psychiatric:        Mood and Affect: Mood normal.        Behavior: Behavior normal.        Thought Content: Thought content normal.        Judgment: Judgment normal.      Assessment and Plan :   1. Encounter for medication refill   2. Essential hypertension   3. Elevated blood pressure reading     Refilled lisinopril for 90 days. Patient is asymptomatic, has no sign of stroke, ACS. PE findings reassuring. Maintain appt with new PCP. Counseled patient on potential for adverse effects with medications prescribed/recommended today, ER and return-to-clinic precautions discussed, patient verbalized understanding.    Jaynee Eagles, [place] 02/25/19 1303

## 2019-03-12 ENCOUNTER — Ambulatory Visit: Payer: Self-pay | Admitting: Internal Medicine

## 2019-04-02 ENCOUNTER — Ambulatory Visit: Payer: 59 | Attending: Internal Medicine | Admitting: Internal Medicine

## 2019-04-02 ENCOUNTER — Encounter: Payer: Self-pay | Admitting: Internal Medicine

## 2019-04-02 ENCOUNTER — Other Ambulatory Visit: Payer: Self-pay

## 2019-04-02 VITALS — BP 138/100 | HR 76 | Temp 98.1°F | Resp 16 | Ht 69.0 in | Wt 183.0 lb

## 2019-04-02 DIAGNOSIS — R17 Unspecified jaundice: Secondary | ICD-10-CM | POA: Diagnosis not present

## 2019-04-02 DIAGNOSIS — Z8719 Personal history of other diseases of the digestive system: Secondary | ICD-10-CM

## 2019-04-02 DIAGNOSIS — I10 Essential (primary) hypertension: Secondary | ICD-10-CM | POA: Diagnosis not present

## 2019-04-02 DIAGNOSIS — H5213 Myopia, bilateral: Secondary | ICD-10-CM | POA: Diagnosis not present

## 2019-04-02 DIAGNOSIS — Z1331 Encounter for screening for depression: Secondary | ICD-10-CM

## 2019-04-02 DIAGNOSIS — Z125 Encounter for screening for malignant neoplasm of prostate: Secondary | ICD-10-CM

## 2019-04-02 DIAGNOSIS — Z7689 Persons encountering health services in other specified circumstances: Secondary | ICD-10-CM

## 2019-04-02 DIAGNOSIS — Z1211 Encounter for screening for malignant neoplasm of colon: Secondary | ICD-10-CM

## 2019-04-02 MED ORDER — LISINOPRIL 20 MG PO TABS: 20.0000 mg | ORAL_TABLET | Freq: Every day | ORAL | 1 refills | Status: DC

## 2019-04-02 MED ORDER — AMLODIPINE BESYLATE 5 MG PO TABS: 5.0000 mg | ORAL_TABLET | Freq: Every day | ORAL | 3 refills | Status: DC

## 2019-04-02 NOTE — Patient Instructions (Signed)
Try to check your blood pressure at least twice a week.  The goal is 130/80 or lower.   Hipertensin en los adultos Hypertension, Adult La presin arterial alta (hipertensin) se produce cuando la fuerza de la sangre bombea a travs de las arterias con mucha fuerza. Las arterias son los vasos sanguneos que transportan la sangre desde el corazn al resto del cuerpo. La hipertensin hace que el corazn haga ms esfuerzo para Insurance account manager y [employer] que las arterias se Armed forces training and education officer o Multimedia programmer. La hipertensin no tratada o no controlada puede causar infarto de miocardio, insuficiencia cardaca, accidente cerebrovascular, enfermedad renal y otros problemas. Una lectura de la presin arterial consta de un nmero ms alto sobre un nmero ms bajo. En condiciones ideales, la presin arterial debe estar por debajo de 120/80. El primer nmero ("superior") es la presin sistlica. Es la medida de la presin de las arterias cuando el corazn late. El segundo nmero ("inferior") es la presin diastlica. Es la medida de la presin en las arterias cuando el corazn se relaja. Cules son las causas? Se desconoce la causa exacta de esta afeccin. Hay algunas afecciones que causan presin arterial alta o estn relacionadas con ella. Qu incrementa el riesgo? Algunos factores de riesgo de hipertensin estn bajo su control. Los siguientes factores pueden hacer que sea ms propenso a Aeronautical engineer afeccin:  Fumar.  Tener diabetes mellitus tipo 2, colesterol alto, o ambos.  No hacer la cantidad suficiente de actividad fsica o ejercicio.  Tener sobrepeso.  Consumir mucha grasa, azcar, caloras o sal (sodio) en su dieta.  Beber alcohol en exceso. Algunos factores de riesgo para la presin arterial alta pueden ser difciles o imposibles de Multimedia programmer. Algunos de estos factores son los siguientes:  Tener enfermedad renal crnica.  Tener antecedentes familiares de presin arterial alta.  Edad. Los  riesgos aumentan con la edad.  Raza. El riesgo es mayor para las Statistician.  Sexo. Antes de los 45aos, los hombres corren ms [employer]. Despus de los 65aos, las mujeres corren ms [employer].  Tener apnea obstructiva del sueo.  Estrs. Cules son los signos o los sntomas? Es posible que la presin arterial alta puede no cause sntomas. La presin arterial muy alta (crisis hipertensiva) puede provocar:  Dolor de cabeza.  Ansiedad.  Falta de aire.  Hemorragia nasal.  Nuseas y vmitos.  Cambios en la visin.  Dolor de pecho intenso.  Convulsiones. Cmo se diagnostica? Esta afeccin se diagnostica al medir su presin arterial mientras se encuentra sentado, con el brazo apoyado sobre una superficie plana, las piernas sin cruzar y los pies bien apoyados en el piso. El brazalete del tensimetro debe colocarse directamente sobre la piel de la parte superior del brazo y al nivel de su corazn. Debe medirla al [facility] veces en el mismo brazo. Determinadas condiciones pueden causar una diferencia de presin arterial entre el brazo izquierdo y Aeronautical engineer. Ciertos factores pueden provocar que las lecturas de la presin arterial sean inferiores o superiores a lo normal por un perodo corto de tiempo:  Si su presin arterial es ms alta cuando se encuentra en el consultorio del mdico que cuando la mide en su hogar, se denomina "hipertensin de bata blanca". La [employer]son de las personas que tienen esta afeccin no deben ser [employer].  Si su presin arterial es ms alta en el hogar que cuando se encuentra en el consultorio del mdico, se denomina "hipertensin enmascarada". La [employer]  tienen esta afeccin deben ser medicadas para controlar la presin arterial. Si tiene una lecturas de presin arterial alta durante una visita o si tiene presin arterial normal con otros factores de riesgo, se le podr pedir que haga lo siguiente:   Que regrese otro da para volver a Chief Technology Officer su presin arterial nuevamente.  Que se controle la presin arterial en su casa durante 1 semana o ms. Si se le diagnostica hipertensin, es posible que se le realicen otros anlisis de sangre o estudios de diagnstico por imgenes para ayudar a su mdico a comprender su riesgo general de tener otras afecciones. Cmo se trata? Esta afeccin se trata haciendo cambios saludables en el estilo de vida, tales como ingerir alimentos saludables, realizar ms ejercicio y reducir el consumo de alcohol. El mdico puede recetarle medicamentos si los cambios en el estilo de vida no son suficientes para Child psychotherapist la presin arterial y si:  Su presin arterial sistlica est por encima de 130.  Su presin arterial diastlica est por encima de 80. La presin arterial deseada puede variar en funcin de las enfermedades, la edad y otros factores personales. Siga estas instrucciones en su casa: Comida y bebida   Siga una dieta con alto contenido de fibras y [place], y con bajo contenido de sodio, Location manager agregada y Physicist, medical. Un ejemplo de plan alimenticio es la dieta DASH (Dietary Approaches to Stop Hypertension, Mtodos alimenticios para detener la hipertensin). Para alimentarse de esta manera: ? Coma mucha fruta y [place]. Trate de que la mitad del plato de cada comida sea de frutas y verduras. ? Coma cereales integrales, como pasta integral, arroz integral o pan integral. Llene aproximadamente un cuarto del plato con cereales integrales. ? Coma y beba productos lcteos con bajo contenido de grasa, como leche descremada o yogur bajo en grasas. ? Evite la ingesta de cortes de carne grasa, carne procesada o curada, y carne de ave con piel. Llene aproximadamente un cuarto del plato con protenas magras, como pescado, pollo sin piel, frijoles, huevos o tofu. ? Evite ingerir alimentos prehechos y procesados. En general, estos tienen mayor cantidad de  sodio, azcar agregada y Wendee Copp.  Reduzca su ingesta diaria de sodio. La mayora de las personas que tienen hipertensin deben comer menos de 1500 mg de sodio por [employer].  No beba alcohol si: ? Su mdico le indica no hacerlo. ? Est embarazada, puede estar embarazada o est tratando de quedar embarazada.  Si bebe alcohol: ? Limite la cantidad que bebe a lo siguiente:  De 0 a 1 medida por da para las mujeres.  De 0 a 2 medidas por da para los hombres. ? Est atento a la cantidad de alcohol que hay en las bebidas que toma. En los [facility], una medida equivale a una botella de cerveza de 12oz (373ml), un vaso de vino de 5oz (125ml) o un vaso de una bebida alcohlica de alta graduacin de 1oz (12ml). Estilo de vida   Trabaje con su mdico para mantener un peso saludable o Administrator, Civil Service. Pregntele cul es el peso recomendado para usted.  Haga al menos 53minutos de ejercicio la [employer] de la [place]. Estas actividades pueden incluir caminar, nadar o andar en bicicleta.  Incluya ejercicios para fortalecer sus msculos (ejercicios de resistencia), como [place] o levantamiento de pesas, como parte de su rutina semanal de ejercicios. Intente realizar 1minutos de este tipo de ejercicios al [employer] a la [place].  No consuma ningn producto que contenga  nicotina o tabaco, como cigarrillos, cigarrillos electrnicos y tabaco de Theatre manager. Si necesita ayuda para dejar de fumar, consulte al mdico.  Contrlese la presin arterial en su casa segn las indicaciones del mdico.  Concurra a todas las visitas de seguimiento como se lo haya indicado el mdico. Esto es importante. Medicamentos  [employer] de venta libre y los recetados solamente como se lo haya indicado el mdico. Siga cuidadosamente las indicaciones. Los medicamentos para la presin arterial deben tomarse segn las indicaciones.  No omita las dosis de medicamentos para la presin arterial. Si lo hace,  estar en riesgo de tener problemas y puede hacer que los medicamentos sean menos eficaces.  Pregntele a su mdico a qu efectos secundarios o reacciones a los Museum/gallery curator. Comunquese con un mdico si:  Piensa que tiene una reaccin a un medicamento que est tomando.  Tiene dolores de cabeza frecuentes (recurrentes).  Se siente mareado.  Tiene hinchazn en los tobillos.  Tiene problemas de visin. Solicite ayuda inmediatamente si:  Siente un dolor de cabeza intenso o confusin.  Siente debilidad inusual o adormecimiento.  Siente que va a desmayarse.  Siente un dolor intenso en el pecho o el abdomen.  Vomita repetidas veces.  Tiene dificultad para respirar. Resumen  La hipertensin se produce cuando la sangre bombea en las arterias con mucha fuerza. Si esta afeccin no se controla, podra correr riesgo de tener complicaciones graves.  La presin arterial deseada puede variar en funcin de las enfermedades, la edad y otros factores personales. Para la [employer], una presin arterial normal es menor que 120/80.  La hipertensin se trata con cambios en el estilo de vida, medicamentos o una combinacin de [place]. Los [employer] Corporation estilo de vida incluyen prdida de peso, ingerir alimentos sanos, seguir una dieta baja en sodio, hacer ms ejercicio y Glass blower/designer consumo de alcohol. Esta informacin no tiene Theme park manager el consejo del mdico. Asegrese de hacerle al mdico cualquier pregunta que tenga. Document Revised: 10/24/2017 Document Reviewed: 10/24/2017 Elsevier Patient Education  2020 [employer].

## 2019-04-02 NOTE — Progress Notes (Signed)
Patient ID: Kenneth Wilkerson, male    DOB: 03/27/1960  MRN: [id number]  CC: New patient visit and chronic disease management  Subjective: Kenneth Wilkerson is a 59 y.o. male who presents for new patient visit.  Kenneth Wilkerson, from [facility] is with him and interprets.  However patient does speak some [place]. His concerns today include:  Patient with history of HTN, HL, increased bilirubin, TB treated in 2000  Previous PCP was Dr. Agustina Caroli. Last seen 2 yrs ago.  Changed due to insurance.  HL:  Told in past that chol elev.  He changed his eating habits and was told he was okay.  HYPERTENSION Currently taking: see medication list Med Adherence: [x]  Yes  - Lisinopril 20 mg  []  No Medication side effects: []  Yes    [x]  No Adherence with salt restriction: [x]  Yes    []  No Home Monitoring?: [x]  Yes    []  No Monitoring Frequency:  Occasionally "when I feel bad." Home BP results range: yesterday 138/85 SOB? []  Yes    [x]  No Chest Pain?: []  Yes    [x]  No Leg swelling?: []  Yes    [x]  No Headaches?: []  Yes    [x]  No Dizziness? []  Yes    [x]  No Comments:   Elev Bilirubin level:  Pt was not aware of this.  No fhx of liver ds.  Drinks beer occasionally.  Last drank 10/2018  Past medical, social, family history reviewed and updated. Patient Active Problem List   Diagnosis Date Noted  . Essential hypertension 08/09/2014  . Erythrocytosis 04/07/2013  . Elevated bilirubin 04/07/2013  . Hypertension   . Hemorrhoids      Current Outpatient Medications on File Prior to Visit  Medication Sig Dispense Refill  . ibuprofen (ADVIL,MOTRIN) 800 MG tablet Take 1 tablet (800 mg total) by mouth every 6 (six) hours as needed. 30 tablet 1   No current facility-administered medications on file prior to visit.    Allergies  Allergen Reactions  . Amoxicillin Other (See Comments)    Shaking/ tremors  . Darvocet [Propoxyphene N-Acetaminophen] Other (See Comments)    HA, NERVOUSNESS  . Sulfa Antibiotics Other (See Comments)    Dizziness, shakey, sweats    Social History   Socioeconomic History  . Marital status: Legally Separated    Spouse name: Not on file  . Number of children: 5  . Years of education: 5th grade  . Highest education level: Not on file  Occupational History  . Occupation: Microbiologist: [employer]  Tobacco Use  . Smoking status: Never Smoker  . Smokeless tobacco: Never Used  Substance and Sexual Activity  . Alcohol use: Yes    Alcohol/week: 3.0 - 5.0 standard drinks    Types: 3 Cans of beer per week    Comment: occ 3 times a week  . Drug use: No  . Sexual activity: Never  Other Topics Concern  . Not on file  Social History Narrative   Lives with his girlfriend.  His wife (they are separated) and 5 children live in [place].   He is from [place], [place].  Arrived in the [place] in 1997.   Social Determinants of Health   Financial Resource Strain:   . Difficulty of Paying Living Expenses:   Food Insecurity:   . Worried About Charity fundraiser in the Last Year:   . Arboriculturist in the Last Year:   [employer]  Needs:   . Lack of Transportation (Medical):   Marland Kitchen Lack of Transportation (Non-Medical):   Physical Activity:   . Days of Exercise per Week:   . Minutes of Exercise per Session:   Stress:   . Feeling of Stress :   Social Connections:   . Frequency of Communication with Friends and Family:   . Frequency of Social Gatherings with Friends and Family:   . Attends Religious Services:   . Active Member of Clubs or Organizations:   . Attends Banker Meetings:   Marland Kitchen Marital Status:   Intimate Partner Violence:   . Fear of Current or Ex-Partner:   . Emotionally Abused:   Marland Kitchen Physically Abused:   . Sexually Abused:     Family History  Family history unknown: Yes    Past Surgical History:  Procedure Laterality Date  . HEMORRHOID SURGERY    . HERNIA REPAIR Bilateral  12/2018    ROS: Review of Systems  Eyes: Positive for visual disturbance.       Has problems seeing things far away.  He wears glasses for distance but they are not prescription lenses.  He purchase them over-the-counter.  Never had a formal eye exam.  Gastrointestinal: Positive for blood in stool (occasionally due to flare of hemorrhoids.  had surgery to have hemorrhoids removed x 2 in past.  BM usually soft.). Negative for abdominal distention.  Genitourinary: Negative for difficulty urinating.     PHYSICAL EXAM: BP (!) 138/100   Pulse 76   Temp 98.1 F (36.7 C)   Resp 16   Ht 5\' 9"  (1.753 m)   Wt 183 lb (83 kg)   SpO2 99%   BMI 27.02 kg/m   Physical Exam  General appearance - alert, well appearing, middle-age Hispanic male and in no distress Mental status - normal mood, behavior, speech, dress, motor activity, and thought processes Nose - normal and patent, no erythema, discharge or polyps Mouth - mucous membranes moist, pharynx normal without lesions Neck - supple, no significant adenopathy Lymphatics - no palpable lymphadenopathy, no hepatosplenomegaly Chest - clear to auscultation, no wheezes, rales or rhonchi, symmetric air entry Heart - normal rate, regular rhythm, normal S1, S2, no murmurs, rubs, clicks or gallops Abdomen - soft, nontender, nondistended, no masses or organomegaly Extremities - peripheral pulses normal, no pedal edema, no clubbing or cyanosis Depression screen [facility] 2/9 04/02/2019 10/14/2017 03/12/2017  Decreased Interest 0 0 0  Down, Depressed, Hopeless 0 0 0  PHQ - 2 Score 0 0 0  Altered sleeping - - -  Tired, decreased energy - - -  Change in appetite - - -  Feeling bad or failure about yourself  - - -  Trouble concentrating - - -  Moving slowly or fidgety/restless - - -  Suicidal thoughts - - -  PHQ-9 Score - - -    CMP Latest Ref Rng & Units 10/14/2017 12/03/2016 08/16/2015  Glucose 65 - 99 mg/dL 92 94 92  BUN 6 - 24 mg/dL 13 9 16   Creatinine  0.76 - 1.27 mg/dL 08/18/2015) ) 2.11(H  Sodium 134 - 144 mmol/L 134 140 138  Potassium 3.5 - 5.2 mmol/L 4.2 4.3 4.3  Chloride 96 - 106 mmol/L 98 97 102  CO2 20 - 29 mmol/L 23 25 27   Calcium 8.7 - 10.2 mg/dL 9.4 9.6 9.4  Total Protein 6.0 - 8.5 g/dL 7.9 8.2 7.7  Total Bilirubin 0.0 - 1.2 mg/dL 4.17(E) 0.7 0.81)  Alkaline Phos 39 -  117 IU/L 82 94 71  AST 0 - 40 IU/L 26 26 23   ALT 0 - 44 IU/L 28 33 24   Lipid Panel     Component Value Date/Time   CHOL 159 10/14/2017 1040   TRIG 78 10/14/2017 1040   HDL 39 (L) 10/14/2017 1040   CHOLHDL 4.1 10/14/2017 1040   CHOLHDL 4.1 03/23/2013 0955   VLDL 12 03/23/2013 0955   LDLCALC 104 (H) 10/14/2017 1040    CBC    Component Value Date/Time   WBC 6.3 10/14/2017 1040   WBC 6.8 08/16/2015 1002   WBC 7.5 04/20/2013 1030   WBC 6.6 01/28/2008 1655   RBC 5.40 10/14/2017 1040   RBC 5.29 08/16/2015 1002   RBC 5.77 04/20/2013 1030   RBC 5.12 01/28/2008 1655   HGB 17.2 10/14/2017 1040   HGB 17.7 (H) 04/20/2013 1030   HCT 49.3 10/14/2017 1040   HCT 52.5 (H) 04/20/2013 1030   PLT 224 10/14/2017 1040   MCV 91 10/14/2017 1040   MCV 91.0 04/20/2013 1030   MCH 31.9 10/14/2017 1040   MCH 32.5 (A) 08/16/2015 1002   MCH 30.7 04/20/2013 1030   MCHC 34.9 10/14/2017 1040   MCHC 35.7 (A) 08/16/2015 1002   MCHC 33.7 04/20/2013 1030   MCHC 34.8 01/28/2008 1655   RDW 12.6 10/14/2017 1040   RDW 13.4 04/20/2013 1030   LYMPHSABS 2.5 10/14/2017 1040   LYMPHSABS 2.6 04/20/2013 1030   MONOABS 0.7 04/20/2013 1030   EOSABS 0.1 10/14/2017 1040   BASOSABS 0.0 10/14/2017 1040   BASOSABS 0.0 04/20/2013 1030    ASSESSMENT AND PLAN: 1. Encounter to establish care 2. Essential hypertension Not at goal. Continue lisinopril.  Add amlodipine. Advised to check blood pressure at least twice a week with goal being 130/80 or lower. - CBC - Comprehensive metabolic panel - Lipid panel - lisinopril (ZESTRIL) 20 MG tablet; Take 1 tablet (20 mg total) by mouth daily.   Dispense: 90 tablet; Refill: 1 - amLODipine (NORVASC) 5 MG tablet; Take 1 tablet (5 mg total) by mouth daily.  Dispense: 90 tablet; Refill: 3  3. 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 Screening for colon cancer Discussed and advised colon cancer screening.  He is agreeable to referral to GI for colonoscopy - Ambulatory referral to Gastroenterology  5. Prostate cancer screening - PSA  6. History of hemorrhoids Encourage patient to keep the bowel movements soft and regular.  Increase fiber in the diet recommended.  Went over foods that are high in fiber.  7. Myopia of both eyes - Ambulatory referral to Ophthalmology    Patient was given the opportunity to ask questions.  Patient verbalized understanding of the plan and was able to repeat key elements of the plan.   Orders Placed This Encounter  Procedures  . CBC  . Comprehensive metabolic panel  . Lipid panel  . PSA  . Bilirubin, direct  . Ambulatory referral to Gastroenterology  . Ambulatory referral to Ophthalmology     Requested Prescriptions   Signed Prescriptions Disp Refills  . lisinopril (ZESTRIL) 20 MG tablet 90 tablet 1    Sig: Take 1 tablet (20 mg total) by mouth daily.  04/22/2013 amLODipine (NORVASC) 5 MG tablet 90 tablet 3    Sig: Take 1 tablet (5 mg total) by mouth daily.    Return in about 3 months (around 07/03/2019).  09/02/2019, MD, FACP

## 2019-04-03 ENCOUNTER — Telehealth: Payer: Self-pay

## 2019-04-03 LAB — CBC
Hematocrit: 52.3 % — ABNORMAL HIGH (ref 37.5–51.0)
Hemoglobin: 18 g/dL — ABNORMAL HIGH (ref 13.0–17.7)
MCH: 32.3 pg (ref 26.6–33.0)
MCHC: 34.4 g/dL (ref 31.5–35.7)
MCV: 94 fL (ref 79–97)
Platelets: 209 10*3/uL (ref 150–450)
RBC: 5.58 x10E6/uL (ref 4.14–5.80)
RDW: 12.1 % (ref 11.6–15.4)
WBC: 6.3 10*3/uL (ref 3.4–10.8)

## 2019-04-03 LAB — COMPREHENSIVE METABOLIC PANEL
ALT: 32 IU/L (ref 0–44)
AST: 27 IU/L (ref 0–40)
Albumin/Globulin Ratio: 1.4 (ref 1.2–2.2)
Albumin: 4.6 g/dL (ref 3.8–4.9)
Alkaline Phosphatase: 88 IU/L (ref 39–117)
BUN/Creatinine Ratio: 22 — ABNORMAL HIGH (ref 9–20)
BUN: 15 mg/dL (ref 6–24)
Bilirubin Total: 1.1 mg/dL (ref 0.0–1.2)
CO2: 23 mmol/L (ref 20–29)
Calcium: 9.4 mg/dL (ref 8.7–10.2)
Chloride: 100 mmol/L (ref 96–106)
Creatinine, Ser: 0.68 mg/dL — ABNORMAL LOW (ref 0.76–1.27)
GFR calc Af Amer: 123 mL/min/{1.73_m2} (ref >59)
GFR calc non Af Amer: 106 mL/min/{1.73_m2} (ref >59)
Globulin, Total: 3.2 g/dL (ref 1.5–4.5)
Glucose: 99 mg/dL (ref 65–99)
Potassium: 4.4 mmol/L (ref 3.5–5.2)
Sodium: 137 mmol/L (ref 134–144)
Total Protein: 7.8 g/dL (ref 6.0–8.5)

## 2019-04-03 LAB — PSA: Prostate Specific Ag, Serum: 0.7 ng/mL (ref 0.0–4.0)

## 2019-04-03 LAB — LIPID PANEL
Chol/HDL Ratio: 4.5 ratio (ref 0.0–5.0)
Cholesterol, Total: 176 mg/dL (ref 100–199)
HDL: 39 mg/dL — ABNORMAL LOW (ref >39)
LDL Chol Calc (NIH): 118 mg/dL — ABNORMAL HIGH (ref 0–99)
Triglycerides: 103 mg/dL (ref 0–149)
VLDL Cholesterol Cal: 19 mg/dL (ref 5–40)

## 2019-04-03 LAB — BILIRUBIN, DIRECT: Bilirubin, Direct: 0.23 mg/dL (ref 0.00–0.40)

## 2019-04-03 NOTE — Progress Notes (Signed)
Let patient know that his kidney and liver function tests are normal.  LDL cholesterol is elevated at 118 with goal being less than 100.  Healthy eating habits and regular exercise will help to lower cholesterol.  The PSA level which is the screening test for prostate cancer was normal.  He has elevation in his blood count.  He was seen by an oncologist for this several years ago and was assessed to have possible sleep apnea.  Please give him a follow-up appointment so that we can assess for possible sleep apnea.

## 2019-04-03 NOTE — Telephone Encounter (Signed)
[employer] Charmian Muff  Id# [id number]  contacted pt to go over lab results pt is aware of results and pt states he will keep his appointment in 3 months to speak with provider about sleep apnea

## 2019-06-08 ENCOUNTER — Other Ambulatory Visit: Payer: Self-pay

## 2019-06-08 ENCOUNTER — Encounter: Payer: Self-pay | Admitting: Internal Medicine

## 2019-06-08 ENCOUNTER — Ambulatory Visit: Payer: 59 | Attending: Internal Medicine | Admitting: Internal Medicine

## 2019-06-08 VITALS — BP 159/112 | HR 92 | Temp 98.6°F | Resp 16 | Wt 183.0 lb

## 2019-06-08 DIAGNOSIS — M25561 Pain in right knee: Secondary | ICD-10-CM

## 2019-06-08 DIAGNOSIS — I10 Essential (primary) hypertension: Secondary | ICD-10-CM | POA: Diagnosis not present

## 2019-06-08 DIAGNOSIS — G8929 Other chronic pain: Secondary | ICD-10-CM

## 2019-06-08 DIAGNOSIS — M545 Low back pain: Secondary | ICD-10-CM

## 2019-06-08 DIAGNOSIS — D751 Secondary polycythemia: Secondary | ICD-10-CM | POA: Diagnosis not present

## 2019-06-08 MED ORDER — IBUPROFEN 600 MG PO TABS: 600.0000 mg | ORAL_TABLET | Freq: Three times a day (TID) | ORAL | 0 refills | Status: DC | PRN

## 2019-06-08 MED ORDER — LISINOPRIL 20 MG PO TABS: 20.0000 mg | ORAL_TABLET | Freq: Every day | ORAL | 1 refills | Status: DC

## 2019-06-08 NOTE — Patient Instructions (Signed)
Please give patient an appointment with the clinical pharmacist in 2 weeks for blood pressure recheck. 

## 2019-06-08 NOTE — Progress Notes (Signed)
Pt states he had left sided head pain last night around 6pm

## 2019-06-08 NOTE — Progress Notes (Signed)
Patient ID: KYSEAN Wilkerson, male    DOB: 1960-12-11  MRN: [id number]  CC: Headache   Subjective: Kenneth Wilkerson is a 59 y.o. male who presents for f/u lab results and HA His concerns today include:  Patient with history of HTN, HL, increased bilirubin, TB treated in 2000  Intermittent HA since last night LT side of head.  Had difficulty sleeping because of it. Took 2 Advil which helped only a little.  No previous HA No associated N/V/dizziness, blurred vision.  Nothing made it worse.  No HA today.  Out of Lisinopril x 2 days.  Has RF on rxn given 03/2019. Tried to RF from last rxn from previous PCP which has expired.   Pt with elev H/H Does not smoke.  Denies loud snoring, daytime sleepiness.  Feels rested in a.m.  Complains of intermittent pain in the right knee x2 weeks.  Worse when he gets up from prolonged sitting and with a lot of walking.  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  He denies any swelling.  No trauma.  Requesting refill on ibuprofen which he takes as needed for pain including intermittent lower back pain. Patient Active Problem List   Diagnosis Date Noted  . History of hemorrhoids 04/02/2019  . Essential hypertension 08/09/2014  . Erythrocytosis 04/07/2013  . Elevated bilirubin 04/07/2013  . Hypertension   . Hemorrhoids      Current Outpatient Medications on File Prior to Visit  Medication Sig Dispense Refill  . amLODipine (NORVASC) 5 MG tablet Take 1 tablet (5 mg total) by mouth daily. 90 tablet 3   No current facility-administered medications on file prior to visit.    Allergies  Allergen Reactions  . Amoxicillin Other (See Comments)    Shaking/ tremors  . Darvocet [Propoxyphene N-Acetaminophen] Other (See Comments)    HA, NERVOUSNESS  . Sulfa Antibiotics Other (See Comments)    Dizziness, shakey, sweats    Social History   Socioeconomic History  . Marital status: Legally Separated   Spouse name: Not on file  . Number of children: 5  . Years of education: 5th grade  . Highest education level: Not on file  Occupational History  . Occupation: Equities trader: [employer]  Tobacco Use  . Smoking status: Never Smoker  . Smokeless tobacco: Never Used  Substance and Sexual Activity  . Alcohol use: Yes    Alcohol/week: 3.0 - 5.0 standard drinks    Types: 3 Cans of beer per week    Comment: occ 3 times a week  . Drug use: No  . Sexual activity: Never  Other Topics Concern  . Not on file  Social History Narrative   Lives with his girlfriend.  His wife (they are separated) and 5 children live in [place].   He is from [place], [place].  Arrived in the [place] in 1997.   Social Determinants of Health   Financial Resource Strain:   . Difficulty of Paying Living Expenses:   Food Insecurity:   . Worried About Programme researcher, broadcasting/film/video in the Last Year:   . Barista in the Last Year:   Transportation Needs:   . Freight forwarder (Medical):   Marland Kitchen Lack of Transportation (Non-Medical):   Physical Activity:   . Days of Exercise per Week:   . Minutes of Exercise per Session:   Stress:   . Feeling of Stress :  Social Connections:   . Frequency of Communication with Friends and Family:   . Frequency of Social Gatherings with Friends and Family:   . Attends Religious Services:   . Active Member of Clubs or Organizations:   . Attends Banker Meetings:   Marland Kitchen Marital Status:   Intimate Partner Violence:   . Fear of Current or Ex-Partner:   . Emotionally Abused:   Marland Kitchen Physically Abused:   . Sexually Abused:     Family History  Family history unknown: Yes    Past Surgical History:  Procedure Laterality Date  . HEMORRHOID SURGERY    . HERNIA REPAIR Bilateral 12/2018    ROS: Review of Systems Negative except as stated above  PHYSICAL EXAM: BP (!) 159/112   Pulse 92   Temp 98.6 F (37 C)   Resp 16   Wt 183 lb (83 kg)   SpO2 98%    BMI 27.02 kg/m   Physical Exam General appearance - alert, well appearing, and in no distress Mental status - normal mood, behavior, speech, dress, motor activity, and thought processes Neck - supple, no significant adenopathy Chest - clear to auscultation, no wheezes, rales or rhonchi, symmetric air entry Heart - normal rate, regular rhythm, normal S1, S2, no murmurs, rubs, clicks or gallops Musculoskeletal -right knee: No edema or joint enlargement.  No point tenderness.  Good passive range of motion.  No crepitus palpated. Extremities -no lower extremity edema Neurologic exam: Cranial nerves grossly intact.  Power 5/5 throughout.  Gross sensation intact.  Gait is normal.  No tenderness over the temporal arteries.  CMP Latest Ref Rng & Units 04/02/2019 10/14/2017 12/03/2016  Glucose 65 - 99 mg/dL 99 92 94  BUN 6 - 24 mg/dL 15 13 9   Creatinine 0.76 - 1.27 mg/dL ) 8.85(O) 2.77(A)  Sodium 134 - 144 mmol/L 137 134 140  Potassium 3.5 - 5.2 mmol/L 4.4 4.2 4.3  Chloride 96 - 106 mmol/L 100 98 97  CO2 20 - 29 mmol/L 23 23 25   Calcium 8.7 - 10.2 mg/dL 9.4 9.4 9.6  Total Protein 6.0 - 8.5 g/dL 7.8 7.9 8.2  Total Bilirubin 0.0 - 1.2 mg/dL 1.1 1.28(N) 0.7  Alkaline Phos 39 - 117 IU/L 88 82 94  AST 0 - 40 IU/L 27 26 26   ALT 0 - 44 IU/L 32 28 33   Lipid Panel     Component Value Date/Time   CHOL 176 04/02/2019 1004   TRIG 103 04/02/2019 1004   HDL 39 (L) 04/02/2019 1004   CHOLHDL 4.5 04/02/2019 1004   CHOLHDL 4.1 03/23/2013 0955   VLDL 12 03/23/2013 0955   LDLCALC 118 (H) 04/02/2019 1004    CBC    Component Value Date/Time   WBC 6.3 04/02/2019 1004   WBC 6.8 08/16/2015 1002   WBC 7.5 04/20/2013 1030   WBC 6.6 01/28/2008 1655   RBC 5.58 04/02/2019 1004   RBC 5.29 08/16/2015 1002   RBC 5.77 04/20/2013 1030   RBC 5.12 01/28/2008 1655   HGB 18.0 (H) 04/02/2019 1004   HGB 17.7 (H) 04/20/2013 1030   HCT 52.3 (H) 04/02/2019 1004   HCT 52.5 (H) 04/20/2013 1030   PLT 209  04/02/2019 1004   MCV 94 04/02/2019 1004   MCV 91.0 04/20/2013 1030   MCH 32.3 04/02/2019 1004   MCH 32.5 (A) 08/16/2015 1002   MCH 30.7 04/20/2013 1030   MCHC 34.4 04/02/2019 1004   MCHC 35.7 (A) 08/16/2015 1002   MCHC  33.7 04/20/2013 1030   MCHC 34.8 01/28/2008 1655   RDW 12.1 04/02/2019 1004   RDW 13.4 04/20/2013 1030   LYMPHSABS 2.5 10/14/2017 1040   LYMPHSABS 2.6 04/20/2013 1030   MONOABS 0.7 04/20/2013 1030   EOSABS 0.1 10/14/2017 1040   BASOSABS 0.0 10/14/2017 1040   BASOSABS 0.0 04/20/2013 1030    ASSESSMENT AND PLAN:  1. Essential hypertension Not at goal.  Out of lisinopril for a few days.  I will resend his prescription.  He will continue the lisinopril and Norvasc.  Follow-up with clinical pharmacist in 2 weeks for blood pressure recheck. - lisinopril (ZESTRIL) 20 MG tablet; Take 1 tablet (20 mg total) by mouth daily.  Dispense: 90 tablet; Refill: 1  2. Polycythemia Of questionable etiology.  Will check erythropoietin level.  If not primary based on lab results, might still pursue sleep study to rule out obstructive sleep apnea as cause. - Erythropoietin  3. Acute pain of right knee Likely early arthritis. - ibuprofen (ADVIL) 600 MG tablet; Take 1 tablet (600 mg total) by mouth every 8 (eight) hours as needed.  Dispense: 60 tablet; Refill: 0  4. Chronic bilateral low back pain without sciatica - ibuprofen (ADVIL) 600 MG tablet; Take 1 tablet (600 mg total) by mouth every 8 (eight) hours as needed.  Dispense: 60 tablet; Refill: 0    Patient was given the opportunity to ask questions.  Patient verbalized understanding of the plan and was able to repeat key elements of the plan.   Orders Placed This Encounter  Procedures  . Erythropoietin     Requested Prescriptions   Signed Prescriptions Disp Refills  . lisinopril (ZESTRIL) 20 MG tablet 90 tablet 1    Sig: Take 1 tablet (20 mg total) by mouth daily.  Marland Kitchen ibuprofen (ADVIL) 600 MG tablet 60 tablet 0    Sig:  Take 1 tablet (600 mg total) by mouth every 8 (eight) hours as needed.    Return in about 4 months (around 10/09/2019) for 2 wks with Lurena Joiner for BP check.  Karle Plumber, MD, FACP

## 2019-06-09 LAB — ERYTHROPOIETIN: Erythropoietin: 10.5 m[IU]/mL (ref 2.6–18.5)

## 2019-06-10 NOTE — Progress Notes (Signed)
Let patient know that I recommend moving forward with getting a sleep study to evaluate for possible sleep apnea as a cause of his elevated blood count.  Let me know if he is willing to do the test and I can go ahead and order it.  We can order it as a home sleep study.

## 2019-06-15 ENCOUNTER — Telehealth: Payer: Self-pay

## 2019-06-15 DIAGNOSIS — D751 Secondary polycythemia: Secondary | ICD-10-CM

## 2019-06-15 NOTE — Telephone Encounter (Signed)
[employer] Kenneth Wilkerson  Id#  [id number] contacted pt to go over results. Pt is aware   Dr. Laural Wilkerson pt states he is okay with getting the in home sleep study

## 2019-06-23 ENCOUNTER — Ambulatory Visit: Payer: 59 | Admitting: Pharmacist

## 2019-07-01 ENCOUNTER — Telehealth: Payer: Self-pay

## 2019-07-01 NOTE — Telephone Encounter (Signed)
[facility] denied home sleep study.  Paperwork was faxed over.

## 2019-07-01 NOTE — Telephone Encounter (Signed)
Will forward to covering provider.

## 2019-07-06 NOTE — Telephone Encounter (Signed)
Appeal information in provider review folder

## 2019-07-09 ENCOUNTER — Ambulatory Visit: Payer: 59 | Admitting: Internal Medicine

## 2019-08-12 ENCOUNTER — Other Ambulatory Visit: Payer: Self-pay | Admitting: Family Medicine

## 2019-10-09 ENCOUNTER — Telehealth: Payer: 59 | Admitting: Internal Medicine

## 2019-12-16 ENCOUNTER — Encounter (HOSPITAL_COMMUNITY): Payer: Self-pay | Admitting: Pediatrics

## 2019-12-16 ENCOUNTER — Emergency Department (HOSPITAL_COMMUNITY)
Admission: EM | Admit: 2019-12-16 | Discharge: 2019-12-16 | Disposition: A | Discharge status: Home / Self Care | Payer: 59 | Attending: Emergency Medicine | Admitting: Emergency Medicine

## 2019-12-16 ENCOUNTER — Other Ambulatory Visit: Payer: Self-pay

## 2019-12-16 DIAGNOSIS — Z79899 Other long term (current) drug therapy: Secondary | ICD-10-CM | POA: Diagnosis not present

## 2019-12-16 DIAGNOSIS — R55 Syncope and collapse: Secondary | ICD-10-CM

## 2019-12-16 DIAGNOSIS — I10 Essential (primary) hypertension: Secondary | ICD-10-CM | POA: Insufficient documentation

## 2019-12-16 LAB — CBG MONITORING, ED
Glucose-Capillary: 112 mg/dL — ABNORMAL HIGH (ref 70–99)
Glucose-Capillary: 142 mg/dL — ABNORMAL HIGH (ref 70–99)

## 2019-12-16 LAB — BASIC METABOLIC PANEL
Anion gap: 16 — ABNORMAL HIGH (ref 5–15)
BUN: 13 mg/dL (ref 6–20)
CO2: 17 mmol/L — ABNORMAL LOW (ref 22–32)
Calcium: 8.9 mg/dL (ref 8.9–10.3)
Chloride: 98 mmol/L (ref 98–111)
Creatinine, Ser: 0.66 mg/dL (ref 0.61–1.24)
GFR, Estimated: >60 mL/min (ref >60)
Glucose, Bld: 111 mg/dL — ABNORMAL HIGH (ref 70–99)
Potassium: 3.6 mmol/L (ref 3.5–5.1)
Sodium: 131 mmol/L — ABNORMAL LOW (ref 135–145)

## 2019-12-16 LAB — URINALYSIS, ROUTINE W REFLEX MICROSCOPIC
Bilirubin Urine: NEGATIVE
Glucose, UA: NEGATIVE mg/dL
Hgb urine dipstick: NEGATIVE
Ketones, ur: NEGATIVE mg/dL
Leukocytes,Ua: NEGATIVE
Nitrite: NEGATIVE
Protein, ur: NEGATIVE mg/dL
Specific Gravity, Urine: 1.011 (ref 1.005–1.030)
pH: 5 (ref 5.0–8.0)

## 2019-12-16 LAB — CBC
HCT: 51.7 % (ref 39.0–52.0)
Hemoglobin: 17.3 g/dL — ABNORMAL HIGH (ref 13.0–17.0)
MCH: 31.6 pg (ref 26.0–34.0)
MCHC: 33.5 g/dL (ref 30.0–36.0)
MCV: 94.5 fL (ref 80.0–100.0)
Platelets: 243 10*3/uL (ref 150–400)
RBC: 5.47 MIL/uL (ref 4.22–5.81)
RDW: 12.7 % (ref 11.5–15.5)
WBC: 10.9 10*3/uL — ABNORMAL HIGH (ref 4.0–10.5)
nRBC: 0 % (ref 0.0–0.2)

## 2019-12-16 NOTE — ED Triage Notes (Signed)
Patient reported he passed out witnessed by family and could not remember the incident. Stated hx of HTN and takes medication

## 2019-12-16 NOTE — ED Provider Notes (Signed)
[facility] EMERGENCY DEPARTMENT Provider Note   CSN: [id number] Arrival date & time: 12/16/19  1757     History Chief Complaint  Patient presents with  . Loss of Consciousness    Kenneth Wilkerson is a 59 y.o. male.  Patient is a 59 year old male with history of hypertension.  He presents today for evaluation of syncope.  Patient was sitting at the dinner table with his family when he became nauseated, then diaphoretic, then passed out.  He had a brief loss of consciousness, then regained consciousness.  There was no reported seizure-like activity and patient denies any palpitations, chest pain, or other prodrome.  He denies any recent nausea or vomiting.  He denies any fevers or chills.  He does report a similar syncopal episode several years ago for which no cause was found.  The history is provided by the patient.  Loss of Consciousness Episode history:  Single Most recent episode:  Today Duration:  2 minutes Timing:  Constant Progression:  Resolved Chronicity:  New Relieved by:  Nothing Worsened by:  Nothing Ineffective treatments:  None tried      Past Medical History:  Diagnosis Date  . Elevated bilirubin 04/07/2013  . Erythrocytosis 04/07/2013  . Hemorrhoids   . Hyperlipidemia   . Hypertension   . Tuberculosis    Treated for in 2000  . Vitamin D deficiency     Patient Active Problem List   Diagnosis Date Noted  . Chronic bilateral low back pain without sciatica 06/08/2019  . History of hemorrhoids 04/02/2019  . Essential hypertension 08/09/2014  . Erythrocytosis 04/07/2013  . Elevated bilirubin 04/07/2013  . Hypertension   . Hemorrhoids     Past Surgical History:  Procedure Laterality Date  . HEMORRHOID SURGERY    . HERNIA REPAIR Bilateral 12/2018       Family History  Family history unknown: Yes    Social History   Tobacco Use  . Smoking status: Never Smoker  . Smokeless tobacco: Never Used  Vaping Use  . Vaping  Use: Never used  Substance Use Topics  . Alcohol use: Yes    Alcohol/week: 3.0 - 5.0 standard drinks    Types: 3 Cans of beer per week    Comment: occ 3 times a week  . Drug use: No    Home Medications Prior to Admission medications   Medication Sig Start Date End Date Taking? Authorizing Provider  ibuprofen (ADVIL) 200 MG tablet Take 200 mg by mouth every 6 (six) hours as needed for mild pain or moderate pain.   Yes [provider]  lisinopril (ZESTRIL) 20 MG tablet Take 1 tablet (20 mg total) by mouth daily. 06/08/19  Yes Marcine Matar, MD  amLODipine (NORVASC) 5 MG tablet Take 1 tablet (5 mg total) by mouth daily. Patient not taking: Reported on 12/16/2019 04/02/19   Marcine Matar, MD  ibuprofen (ADVIL) 600 MG tablet Take 1 tablet (600 mg total) by mouth every 8 (eight) hours as needed. Patient not taking: Reported on 12/16/2019 06/08/19   Marcine Matar, MD    Allergies    Amoxicillin, Darvocet [propoxyphene n-acetaminophen], and Sulfa antibiotics  Review of Systems   Review of Systems  Cardiovascular: Positive for syncope.  All other systems reviewed and are negative.   Physical Exam Updated Vital Signs BP 114/79   Pulse 83   Temp 97.7 F (36.5 C) (Oral)   Resp 17   SpO2 97%   Physical Exam Vitals and  nursing note reviewed.  Constitutional:      General: He is not in acute distress.    Appearance: He is well-developed. He is not diaphoretic.  HENT:     Head: Normocephalic and atraumatic.  Eyes:     Extraocular Movements: Extraocular movements intact.     Pupils: Pupils are equal, round, and reactive to light.  Cardiovascular:     Rate and Rhythm: Normal rate and regular rhythm.     Heart sounds: No murmur heard.  No friction rub.  Pulmonary:     Effort: Pulmonary effort is normal. No respiratory distress.     Breath sounds: Normal breath sounds. No wheezing or rales.  Abdominal:     General: Bowel sounds are normal. There is no  distension.     Palpations: Abdomen is soft.     Tenderness: There is no abdominal tenderness.  Musculoskeletal:        General: Normal range of motion.     Cervical back: Normal range of motion and neck supple.  Skin:    General: Skin is warm and dry.  Neurological:     General: No focal deficit present.     Mental Status: He is alert and oriented to person, place, and time. Mental status is at baseline.     Cranial Nerves: No cranial nerve deficit.     Motor: No weakness.     Coordination: Coordination normal.     ED Results / Procedures / Treatments   Labs (all labs ordered are listed, but only abnormal results are displayed) Labs Reviewed  BASIC METABOLIC PANEL - Abnormal; Notable for the following components:      Result Value   Sodium 131 (*)    CO2 17 (*)    Glucose, Bld 111 (*)    Anion gap 16 (*)    All other components within normal limits  CBC - Abnormal; Notable for the following components:   WBC 10.9 (*)    Hemoglobin 17.3 (*)    All other components within normal limits  CBG MONITORING, ED - Abnormal; Notable for the following components:   Glucose-Capillary 112 (*)    All other components within normal limits  CBG MONITORING, ED - Abnormal; Notable for the following components:   Glucose-Capillary 142 (*)    All other components within normal limits  URINALYSIS, ROUTINE W REFLEX MICROSCOPIC    EKG EKG Interpretation  Date/Time:  Wednesday December 16 2019 17:59:48 EST Ventricular Rate:  81 PR Interval:  166 QRS Duration: 82 QT Interval:  378 QTC Calculation: 439 R Axis:   46 Text Interpretation: Normal sinus rhythm Normal ECG Confirmed by Geoffery Lyons ([id number]) on 12/16/2019 9:45:50 PM   Radiology No results found.  Procedures Procedures (including critical care time)  Medications Ordered in ED Medications - No data to display  ED Course  I have reviewed the triage vital signs and the nursing notes.  Pertinent labs & imaging results  that were available during my care of the patient were reviewed by me and considered in my medical decision making (see chart for details).    MDM Rules/Calculators/A&P  Patient presenting with complaints of syncope that occurred while he was having dinner with his family.  Patient's vitals are stable and he appears in no distress.  Physical examination is unremarkable and laboratory studies and EKG are normal.  Patient has been observed for several hours in the ER and has had no recurrence.  He has remained stable on telemetry with  no ectopy.  I suspect some sort of vasovagal etiology.  Patient has had no history of seizures and this does not sound like a seizure based on what transpired and history taken from the wife who witnessed the event.  He also has no prior cardiac history and I doubt an acute cardiac event.  At this point, I feel as though discharge is appropriate.  If patient has additional episodes, he should return to the ER to be reevaluated.  Final Clinical Impression(s) / ED Diagnoses Final diagnoses:  None    Rx / DC Orders ED Discharge Orders    None       Geoffery Lyons, MD 12/16/19 2147

## 2019-12-16 NOTE — Discharge Instructions (Addendum)
Drink plenty of fluids and get plenty of rest.  Return to the ER if you develop chest pain, recurrent syncopal episodes, or other new and concerning symptoms.

## 2019-12-21 ENCOUNTER — Ambulatory Visit: Payer: 59 | Attending: Internal Medicine | Admitting: Internal Medicine

## 2019-12-21 ENCOUNTER — Other Ambulatory Visit: Payer: Self-pay

## 2019-12-21 DIAGNOSIS — Z1211 Encounter for screening for malignant neoplasm of colon: Secondary | ICD-10-CM | POA: Diagnosis not present

## 2019-12-21 DIAGNOSIS — Z23 Encounter for immunization: Secondary | ICD-10-CM

## 2019-12-21 DIAGNOSIS — I10 Essential (primary) hypertension: Secondary | ICD-10-CM

## 2019-12-21 DIAGNOSIS — R55 Syncope and collapse: Secondary | ICD-10-CM | POA: Diagnosis not present

## 2019-12-21 DIAGNOSIS — R519 Headache, unspecified: Secondary | ICD-10-CM

## 2019-12-21 DIAGNOSIS — D751 Secondary polycythemia: Secondary | ICD-10-CM

## 2019-12-21 MED ORDER — VERAPAMIL HCL ER 120 MG PO TBCR: 120.0000 mg | EXTENDED_RELEASE_TABLET | Freq: Every day | ORAL | 1 refills | Status: DC

## 2019-12-21 MED ORDER — LISINOPRIL 20 MG PO TABS: 20.0000 mg | ORAL_TABLET | Freq: Every day | ORAL | 1 refills | Status: DC

## 2019-12-21 MED ORDER — AMLODIPINE BESYLATE 5 MG PO TABS: 2.5000 mg | ORAL_TABLET | Freq: Every day | ORAL | 1 refills | Status: DC

## 2019-12-21 NOTE — Progress Notes (Signed)
Virtual Visit via Telephone Note  I connected with Kenneth Wilkerson on 12/21/19 at  1:50 PM EST by telephone and verified that I am speaking with the correct person using two identifiers.  Location: Patient: home Provider: Office The pt, myself and Diego from [employer] participated in this encounter. #[id number]   I discussed the limitations, risks, security and privacy concerns of performing an evaluation and management service by telephone and the availability of in person appointments. I also discussed with the patient that there may be a patient responsible charge related to this service. The patient expressed understanding and agreed to proceed.   History of Present Illness: Patient with history of HTN, HL, increased bilirubin, TB treated in 2000.  This is ER f/u  Pt seen in ER 12/16/2019 for witnessed syncope. He was eating dinner with his family when he became nauseated, diaphoretic then passed out.  Reports he was out for about 2 mths.  Seen in ER where he was observed for several hrs, vitals stable, EKG was okay, labs okay except for NA 131.  No further episode.  Since then he has noticed stabbing pain LT side of head that comes and goes several times a day. LT side of head and lip feels numb.  Headache lasts about 30 mins.  Not taking anything for HA.  No nausea or vomiting, blurred vision or light sensitivity. Some tearing from LT eye  Associated with the HA.  No trauma to the head when he had recent syncope  HTN: stopped taking Norvasc after 3-4 days because it was causing chest pains.  Only taking Lisinopril 20 mg daily.  Checks BP daily.  BP today was 119/86.  Other recent readings 120/87, 117/87, 114/88.  -he has been trying salt in foods  Polycythemia:  We tried to get him approved for home sleep study from last visit. Pt reports his insurance does not cover it. Hb down slightly on recent blood test done in ER.  HM:  Due for flu shot. Pt agreeable to getting the  flu shot.  Due for colon cancer screening  Outpatient Encounter Medications as of 12/21/2019  Medication Sig Note  . amLODipine (NORVASC) 5 MG tablet Take 1 tablet (5 mg total) by mouth daily. (Patient not taking: Reported on 12/16/2019) 12/16/2019: Pt no longer takes med.   Marland Kitchen ibuprofen (ADVIL) 200 MG tablet Take 200 mg by mouth every 6 (six) hours as needed for mild pain or moderate pain.   Marland Kitchen ibuprofen (ADVIL) 600 MG tablet Take 1 tablet (600 mg total) by mouth every 8 (eight) hours as needed. (Patient not taking: Reported on 12/16/2019) 12/16/2019: Pt no longer takes med.   Marland Kitchen lisinopril (ZESTRIL) 20 MG tablet Take 1 tablet (20 mg total) by mouth daily.    No facility-administered encounter medications on file as of 12/21/2019.      Observations/Objective: Results for orders placed or performed during the hospital encounter of 12/16/19  Basic metabolic panel  Result Value Ref Range   Sodium 131 (L) 135 - 145 mmol/L   Potassium 3.6 3.5 - 5.1 mmol/L   Chloride 98 98 - 111 mmol/L   CO2 17 (L) 22 - 32 mmol/L   Glucose, Bld 111 (H) 70 - 99 mg/dL   BUN 13 6 - 20 mg/dL   Creatinine, Ser 6.31 0.61 - 1.24 mg/dL   Calcium 8.9 8.9 - 49.7 mg/dL   GFR, Estimated >02 >63 mL/min   Anion gap 16 (H) 5 - 15  CBC  Result Value Ref Range   WBC 10.9 (H) 4.0 - 10.5 K/uL   RBC 5.47 4.22 - 5.81 MIL/uL   Hemoglobin 17.3 (H) 13.0 - 17.0 g/dL   HCT 00.4 39 - 52 %   MCV 94.5 80.0 - 100.0 fL   MCH 31.6 26.0 - 34.0 pg   MCHC 33.5 30.0 - 36.0 g/dL   RDW 59.9 77.4 - 14.2 %   Platelets 243 150 - 400 K/uL   nRBC 0.0 0.0 - 0.2 %  Urinalysis, Routine w reflex microscopic Urine, Clean Catch  Result Value Ref Range   Color, Urine YELLOW YELLOW   APPearance CLEAR CLEAR   Specific Gravity, Urine 1.011 1.005 - 1.030   pH 5.0 5.0 - 8.0   Glucose, UA NEGATIVE NEGATIVE mg/dL   Hgb urine dipstick NEGATIVE NEGATIVE   Bilirubin Urine NEGATIVE NEGATIVE   Ketones, ur NEGATIVE NEGATIVE mg/dL   Protein, ur NEGATIVE  NEGATIVE mg/dL   Nitrite NEGATIVE NEGATIVE   Leukocytes,Ua NEGATIVE NEGATIVE  CBG monitoring, ED  Result Value Ref Range   Glucose-Capillary 112 (H) 70 - 99 mg/dL   Comment 1 Notify RN    Comment 2 Document in Chart   CBG monitoring, ED  Result Value Ref Range   Glucose-Capillary 142 (H) 70 - 99 mg/dL   Comment 1 Notify RN    Comment 2 Document in Chart      Assessment and Plan: 1. Essential hypertension Diastolic BP not at goal. Continue lisinopril. Add low-dose verapamil. - lisinopril (ZESTRIL) 20 MG tablet; Take 1 tablet (20 mg total) by mouth daily.  Dispense: 90 tablet; Refill: 1 - Basic Metabolic Panel; Future  2. Vasovagal syncope Advised to stay hydrated.  Advised to report any future episodes  3. Screening for colon cancer - Ambulatory referral to Gastroenterology  4. Need for influenza vaccination Patient will come to get flu shot from our clinical pharmacist  5. Polycythemia Improved.  We will hold off on getting sleep study since his insurance does not cover and patient unable to afford out-of-pocket.  6. Headache disorder Differential diagnosis include cluster type headache.  Given headache started after recent syncope, will image the head.  We will also start him on verapamil for prophylaxis. - CT Head Wo Contrast; Future - verapamil (CALAN-SR) 120 MG CR tablet; Take 1 tablet (120 mg total) by mouth at bedtime. Stop AMlodipine  Dispense: 30 tablet; Refill: 1   Follow Up Instructions: 6 wks   I discussed the assessment and treatment plan with the patient. The patient was provided an opportunity to ask questions and all were answered. The patient agreed with the plan and demonstrated an understanding of the instructions.   The patient was advised to call back or seek an in-person evaluation if the symptoms worsen or if the condition fails to improve as anticipated.  I provided 31 minutes of non-face-to-face time during this encounter.   Jonah Blue,  MD

## 2020-01-06 ENCOUNTER — Ambulatory Visit (HOSPITAL_COMMUNITY)
Admission: RE | Admit: 2020-01-06 | Discharge: 2020-01-06 | Disposition: A | Discharge status: Home / Self Care | Payer: 59 | Source: Ambulatory Visit | Attending: Internal Medicine | Admitting: Internal Medicine

## 2020-01-06 ENCOUNTER — Other Ambulatory Visit: Payer: Self-pay

## 2020-01-06 DIAGNOSIS — R519 Headache, unspecified: Secondary | ICD-10-CM | POA: Insufficient documentation

## 2020-01-07 NOTE — Progress Notes (Signed)
CT of head was normal.

## 2020-01-28 ENCOUNTER — Encounter: Payer: Self-pay | Admitting: Gastroenterology

## 2020-03-21 ENCOUNTER — Encounter: Payer: 59 | Admitting: Gastroenterology

## 2020-04-22 ENCOUNTER — Encounter (HOSPITAL_COMMUNITY): Payer: Self-pay | Admitting: Emergency Medicine

## 2020-04-22 ENCOUNTER — Other Ambulatory Visit: Payer: Self-pay

## 2020-04-22 ENCOUNTER — Ambulatory Visit (HOSPITAL_COMMUNITY)
Admission: EM | Admit: 2020-04-22 | Discharge: 2020-04-22 | Disposition: A | Discharge status: Home / Self Care | Payer: 59 | Attending: Emergency Medicine | Admitting: Emergency Medicine

## 2020-04-22 DIAGNOSIS — B029 Zoster without complications: Secondary | ICD-10-CM

## 2020-04-22 MED ORDER — TETRACAINE HCL 0.5 % OP SOLN
OPHTHALMIC | Status: AC
  Filled 2020-04-22: qty 4

## 2020-04-22 MED ORDER — FLUORESCEIN SODIUM 1 MG OP STRP
ORAL_STRIP | OPHTHALMIC | Status: AC
  Filled 2020-04-22: qty 1

## 2020-04-22 MED ORDER — HYDROCODONE-ACETAMINOPHEN 5-325 MG PO TABS
1.0000 | ORAL_TABLET | Freq: Four times a day (QID) | ORAL | 0 refills | Status: DC | PRN
Start: 2020-04-22 — End: 2020-11-08

## 2020-04-22 MED ORDER — IBUPROFEN 600 MG PO TABS: 600.0000 mg | ORAL_TABLET | Freq: Four times a day (QID) | ORAL | 0 refills | Status: DC | PRN

## 2020-04-22 MED ORDER — TETRACAINE HCL 0.5 % OP SOLN
1.0000 [drp] | Freq: Once | OPHTHALMIC | Status: AC
  Administered 2020-04-22: 2 [drp] via OPHTHALMIC

## 2020-04-22 MED ORDER — VALACYCLOVIR HCL 1 G PO TABS: 1000.0000 mg | ORAL_TABLET | Freq: Three times a day (TID) | ORAL | 0 refills | Status: AC

## 2020-04-22 MED ORDER — FLUORESCEIN SODIUM 1 MG OP STRP
1.0000 | ORAL_STRIP | Freq: Once | OPHTHALMIC | Status: AC
  Administered 2020-04-22: 1 via OPHTHALMIC

## 2020-04-22 NOTE — ED Triage Notes (Signed)
Pt presents with headache, right side ear pain and swelling xs 5 days. States has been using OTC ear drops and ibuprofen with minimal relief.

## 2020-04-22 NOTE — Discharge Instructions (Addendum)
Finish the Valtrex, even if you feel better.  Take 600 mg of ibuprofen and a Tylenol-containing product 3 or 4 times a day as needed for pain.  Either ibuprofen with 1000 mg of Tylenol for mild to moderate or ibuprofen with 1-2 Norco for severe pain.  Apply the Bactroban when the blisters rupture.  You are contagious until the rash crusts and heals over.

## 2020-04-22 NOTE — ED Provider Notes (Signed)
HPI  SUBJECTIVE:  Kenneth Wilkerson is a 60 y.o. male who presents with 5 days of a burning intermittent, hours long right-sided headache over his right scalp, forehead, eye and ear in the V1 distribution.  He reports painful swelling anterior to his right ear, right ear pain, pain at the corner of his eye.  He denies itching, dull throbbing pain.  He reports "painful bumps" starting on his scalp yesterday.  He denies eye pain, visual changes, visual loss, fevers, nausea, vomiting, photophobia, neck stiffness.  No antipyretic in the past 6 hours.  He has tried ibuprofen 600 mg twice daily with improvement in his symptoms.  No aggravating factors.  He has a past medical history of hypertension, tuberculosis, treated.  No known history of varicella, shingles.  He did not get the shingles vaccination.  DUK:GURKYHC, Binnie Rail, MD   Past Medical History:  Diagnosis Date  . Elevated bilirubin 04/07/2013  . Erythrocytosis 04/07/2013  . Hemorrhoids   . Hyperlipidemia   . Hypertension   . Tuberculosis    Treated for in 2000  . Vitamin D deficiency     Past Surgical History:  Procedure Laterality Date  . HEMORRHOID SURGERY    . HERNIA REPAIR Bilateral 12/2018    Family History  Family history unknown: Yes    Social History   Tobacco Use  . Smoking status: Never Smoker  . Smokeless tobacco: Never Used  Vaping Use  . Vaping Use: Never used  Substance Use Topics  . Alcohol use: Yes    Alcohol/week: 3.0 - 5.0 standard drinks    Types: 3 Cans of beer per week    Comment: occ 3 times a week  . Drug use: No    No current facility-administered medications for this encounter.  Current Outpatient Medications:  .  HYDROcodone-acetaminophen (NORCO/VICODIN) 5-325 MG tablet, Take 1-2 tablets by mouth every 6 (six) hours as needed for moderate pain., Disp: 12 tablet, Rfl: 0 .  ibuprofen (ADVIL) 600 MG tablet, Take 1 tablet (600 mg total) by mouth every 6 (six) hours as needed., Disp: 20  tablet, Rfl: 0 .  valACYclovir (VALTREX) 1000 MG tablet, Take 1 tablet (1,000 mg total) by mouth 3 (three) times daily for 7 days., Disp: 21 tablet, Rfl: 0 .  lisinopril (ZESTRIL) 20 MG tablet, Take 1 tablet (20 mg total) by mouth daily., Disp: 90 tablet, Rfl: 1 .  verapamil (CALAN-SR) 120 MG CR tablet, Take 1 tablet (120 mg total) by mouth at bedtime. Stop AMlodipine, Disp: 30 tablet, Rfl: 1  Allergies  Allergen Reactions  . Amoxicillin Other (See Comments)    Shaking/ tremors  . Darvocet [Propoxyphene N-Acetaminophen] Other (See Comments)    HA, NERVOUSNESS  . Sulfa Antibiotics Other (See Comments)    Dizziness, shakey, sweats     ROS  As noted in HPI.   Physical Exam  BP (!) 139/97 (BP Location: Left Arm)   Pulse 88   Temp 98.8 F (37.1 C) (Oral)   Resp 16   SpO2 97%   Constitutional: Well developed, well nourished, no acute distress Eyes:  EOMI, conjunctiva normal bilaterally.  No photophobia.  No dendrites seen on flourescin exam. HENT: Normocephalic, atraumatic,mucus membranes moist.  R external ear, EAC, TM normal. Respiratory: Normal inspiratory effort Cardiovascular: Normal rate GI: nondistended skin: Tender raised erythematous rash in the V1 dermatome over the forehead,  scalp, above right eyebrow and medial right orbit. no vesicles yet. nose normal.  Lymph: Positive right preauricular and anterior cervical lymphadenopathy Musculoskeletal: no deformities Neurologic: Alert & oriented x 3, no focal neuro deficits Psychiatric: Speech and behavior appropriate   ED Course   Medications  tetracaine (PONTOCAINE) 0.5 % ophthalmic solution 1-2 drop (2 drops Right Eye Given by Other 04/22/20 1121)  fluorescein ophthalmic strip 1 strip (1 strip Right Eye Given 04/22/20 1122)    No orders of the defined types were placed in this encounter.   No results found for this or any previous visit (from the past 24 hour(s)). No results found.  ED Clinical  Impression  1. Herpes zoster without complication      ED Assessment/Plan  [place] Narcotic database reviewed for this patient, and feel that the risk/benefit ratio today is favorable for proceeding with a prescription for controlled substance.  Presentation consistent with shingles.  Will send home with Valtrex, ibuprofen/Tylenol-containing product 3-4 times a day as needed for pain, Bactroban, ophthalmology follow-up in 2 days to make sure that it has not gotten into his cornea.  Dr. Georga Hacking on call.  Discussed MDM, treatment plan, and plan for follow-up with patient. Discussed sn/sx that should prompt return to the ED. patient agrees with plan.   Meds ordered this encounter  Medications  . tetracaine (PONTOCAINE) 0.5 % ophthalmic solution 1-2 drop  . fluorescein ophthalmic strip 1 strip  . HYDROcodone-acetaminophen (NORCO/VICODIN) 5-325 MG tablet    Sig: Take 1-2 tablets by mouth every 6 (six) hours as needed for moderate pain.    Dispense:  12 tablet    Refill:  0  . ibuprofen (ADVIL) 600 MG tablet    Sig: Take 1 tablet (600 mg total) by mouth every 6 (six) hours as needed.    Dispense:  20 tablet    Refill:  0  . valACYclovir (VALTREX) 1000 MG tablet    Sig: Take 1 tablet (1,000 mg total) by mouth 3 (three) times daily for 7 days.    Dispense:  21 tablet    Refill:  0       ?    Domenick Gong, MD 04/22/20 [phone]

## 2020-05-03 ENCOUNTER — Ambulatory Visit (HOSPITAL_COMMUNITY)
Admission: EM | Admit: 2020-05-03 | Discharge: 2020-05-03 | Disposition: A | Discharge status: Home / Self Care | Payer: 59 | Attending: Urgent Care | Admitting: Urgent Care

## 2020-05-03 ENCOUNTER — Other Ambulatory Visit: Payer: Self-pay

## 2020-05-03 ENCOUNTER — Encounter (HOSPITAL_COMMUNITY): Payer: Self-pay | Admitting: Emergency Medicine

## 2020-05-03 DIAGNOSIS — R519 Headache, unspecified: Secondary | ICD-10-CM | POA: Diagnosis not present

## 2020-05-03 DIAGNOSIS — K921 Melena: Secondary | ICD-10-CM | POA: Diagnosis not present

## 2020-05-03 DIAGNOSIS — B0229 Other postherpetic nervous system involvement: Secondary | ICD-10-CM

## 2020-05-03 MED ORDER — PREGABALIN 50 MG PO CAPS: 50.0000 mg | ORAL_CAPSULE | Freq: Three times a day (TID) | ORAL | 0 refills | Status: DC

## 2020-05-03 NOTE — Discharge Instructions (Addendum)
Do not use any nonsteroidal anti-inflammatories (NSAIDs) like ibuprofen, Motrin, naproxen, Aleve, etc. which are all available over-the-counter.  I am concerned that the ibuprofen that you have been using is causing bleeding in your gut.  Please make sure that you follow-up with your regular doctor for recheck if this persists.  In the meantime just use the hydrocodone you have left together with Lyrica and Tylenol at a dose of 500mg -650mg  once every 6 hours as needed for your aches, pains.

## 2020-05-03 NOTE — ED Triage Notes (Signed)
Pt presents with facial pain following shingles. States has been taking pain medication prescribed with some relief, but states right after medication wears off, the pain is back.

## 2020-05-03 NOTE — ED Provider Notes (Signed)
[facility]   MRN: [id number] DOB: February 01, 1960  Subjective:   Kenneth Wilkerson is a 60 y.o. male presenting for ongoing right-sided facial pain that extends into the temporal area of his scalp.  Patient was recently treated for shingles.  He has been using Tylenol but then switched to ibuprofen.  Has been using hydrocodone as well.  States that hydrocodone has helped the best but does not want to get another prescription for this.  He has noticed some slight bleeding in his stools.  Denies confusion, weakness, numbness or tingling, history of stroke.  No current facility-administered medications for this encounter.  Current Outpatient Medications:  .  HYDROcodone-acetaminophen (NORCO/VICODIN) 5-325 MG tablet, Take 1-2 tablets by mouth every 6 (six) hours as needed for moderate pain., Disp: 12 tablet, Rfl: 0 .  ibuprofen (ADVIL) 600 MG tablet, Take 1 tablet (600 mg total) by mouth every 6 (six) hours as needed., Disp: 20 tablet, Rfl: 0 .  lisinopril (ZESTRIL) 20 MG tablet, Take 1 tablet (20 mg total) by mouth daily., Disp: 90 tablet, Rfl: 1 .  verapamil (CALAN-SR) 120 MG CR tablet, Take 1 tablet (120 mg total) by mouth at bedtime. Stop AMlodipine, Disp: 30 tablet, Rfl: 1   Allergies  Allergen Reactions  . Amoxicillin Other (See Comments)    Shaking/ tremors  . Darvocet [Propoxyphene N-Acetaminophen] Other (See Comments)    HA, NERVOUSNESS  . Sulfa Antibiotics Other (See Comments)    Dizziness, shakey, sweats    Past Medical History:  Diagnosis Date  . Elevated bilirubin 04/07/2013  . Erythrocytosis 04/07/2013  . Hemorrhoids   . Hyperlipidemia   . Hypertension   . Tuberculosis    Treated for in 2000  . Vitamin D deficiency      Past Surgical History:  Procedure Laterality Date  . HEMORRHOID SURGERY    . HERNIA REPAIR Bilateral 12/2018    Family History  Family history unknown: Yes    Social History   Tobacco Use  . Smoking status: Never Smoker   . Smokeless tobacco: Never Used  Vaping Use  . Vaping Use: Never used  Substance Use Topics  . Alcohol use: Yes    Alcohol/week: 3.0 - 5.0 standard drinks    Types: 3 Cans of beer per week    Comment: occ 3 times a week  . Drug use: No    ROS   Objective:   Vitals: BP (!) 154/88 (BP Location: Right Arm)   Pulse 85   Temp 98.7 F (37.1 C) (Oral)   Resp 17   SpO2 98%   Physical Exam Constitutional:      General: He is not in acute distress.    Appearance: Normal appearance. He is well-developed and normal weight. He is not ill-appearing, toxic-appearing or diaphoretic.  HENT:     Head: Normocephalic and atraumatic.      Right Ear: External ear normal.     Left Ear: External ear normal.     Nose: Nose normal.     Mouth/Throat:     Pharynx: Oropharynx is clear.  Eyes:     General: No scleral icterus.       Right eye: No discharge.        Left eye: No discharge.     Extraocular Movements: Extraocular movements intact.     Pupils: Pupils are equal, round, and reactive to light.  Cardiovascular:     Rate and Rhythm: Normal rate.  Pulmonary:  Effort: Pulmonary effort is normal.  Musculoskeletal:     Cervical back: Normal range of motion.  Neurological:     Mental Status: He is alert and oriented to person, place, and time.     Cranial Nerves: No cranial nerve deficit.     Motor: No weakness.     Coordination: Coordination normal.     Gait: Gait normal.     Deep Tendon Reflexes: Reflexes normal.  Psychiatric:        Mood and Affect: Mood normal.        Behavior: Behavior normal.        Thought Content: Thought content normal.        Judgment: Judgment normal.     Assessment and Plan :   PDMP not reviewed this encounter.  1. Postherpetic neuralgia   2. Facial pain   3. Blood in stool     Patient is still undergoing postherpetic neuralgia.  Offered refill of the hydrocodone but patient declined.  Recommended scheduling Tylenol, stop ibuprofen as I am  concerned he is having some GI bleeding from excessive use of this NSAID.  Recommended Lyrica for his neuropathic pain.  Follow-up with PCP as soon as possible. Counseled patient on potential for adverse effects with medications prescribed/recommended today, ER and return-to-clinic precautions discussed, patient verbalized understanding.    Wallis Bamberg, PA-C 05/03/20 1139

## 2020-07-13 ENCOUNTER — Other Ambulatory Visit: Payer: Self-pay

## 2020-07-13 ENCOUNTER — Ambulatory Visit (HOSPITAL_COMMUNITY)
Admission: EM | Admit: 2020-07-13 | Discharge: 2020-07-13 | Disposition: A | Discharge status: Home / Self Care | Payer: 59 | Attending: Medical Oncology | Admitting: Medical Oncology

## 2020-07-13 ENCOUNTER — Encounter (HOSPITAL_COMMUNITY): Payer: Self-pay

## 2020-07-13 DIAGNOSIS — I10 Essential (primary) hypertension: Secondary | ICD-10-CM

## 2020-07-13 MED ORDER — LISINOPRIL 20 MG PO TABS: 20.0000 mg | ORAL_TABLET | Freq: Every day | ORAL | 0 refills | Status: DC

## 2020-07-13 NOTE — ED Triage Notes (Signed)
Pt presents with a request for a refill on Lisinopril 20 mg. He states he ran out yesterday.

## 2020-07-13 NOTE — ED Provider Notes (Signed)
[facility]    CSN: [id number] Arrival date & time: 07/13/20  1047      History   Chief Complaint Chief Complaint  Patient presents with   Medication Refill    HPI Kenneth Wilkerson is a 60 y.o. male.   HPI  Hypertension: Pt has chronic hypertension. He reports that this is normally well controlled. He was previously on Lisinopril 20 mg but reports that he ran out yesterday. Since then BP has been elevated. No chest pains, SOB, peripheral edema. He requests medication refill.    Past Medical History:  Diagnosis Date   Elevated bilirubin 04/07/2013   Erythrocytosis 04/07/2013   Hemorrhoids    Hyperlipidemia    Hypertension    Tuberculosis    Treated for in 2000   Vitamin D deficiency     Patient Active Problem List   Diagnosis Date Noted   Chronic bilateral low back pain without sciatica 06/08/2019   History of hemorrhoids 04/02/2019   Essential hypertension 08/09/2014   Erythrocytosis 04/07/2013   Elevated bilirubin 04/07/2013   Hypertension    Hemorrhoids     Past Surgical History:  Procedure Laterality Date   HEMORRHOID SURGERY     HERNIA REPAIR Bilateral 12/2018       Home Medications    Prior to Admission medications   Medication Sig Start Date End Date Taking? Authorizing Provider  HYDROcodone-acetaminophen (NORCO/VICODIN) 5-325 MG tablet Take 1-2 tablets by mouth every 6 (six) hours as needed for moderate pain. 04/22/20   Domenick Gong, MD  ibuprofen (ADVIL) 600 MG tablet Take 1 tablet (600 mg total) by mouth every 6 (six) hours as needed. 04/22/20   Domenick Gong, MD  lisinopril (ZESTRIL) 20 MG tablet Take 1 tablet (20 mg total) by mouth daily. 12/21/19   Marcine Matar, MD  pregabalin (LYRICA) 50 MG capsule Take 1 capsule (50 mg total) by mouth 3 (three) times daily. 05/03/20   Wallis Bamberg, PA-C  verapamil (CALAN-SR) 120 MG CR tablet Take 1 tablet (120 mg total) by mouth at bedtime. Stop AMlodipine 12/21/19   Marcine Matar, MD    Family History Family History  Family history unknown: Yes    Social History Social History   Tobacco Use   Smoking status: Never   Smokeless tobacco: Never  Vaping Use   Vaping Use: Never used  Substance Use Topics   Alcohol use: Yes    Alcohol/week: 3.0 - 5.0 standard drinks    Types: 3 Cans of beer per week    Comment: occ 3 times a week   Drug use: No     Allergies   Amoxicillin, Darvocet [propoxyphene n-acetaminophen], and Sulfa antibiotics   Review of Systems Review of Systems  As stated above in HPI Physical Exam Triage Vital Signs ED Triage Vitals  Enc Vitals Group     BP 07/13/20 1142 (!) 136/98     Pulse Rate 07/13/20 1142 88     Resp 07/13/20 1142 17     Temp 07/13/20 1142 98.4 F (36.9 C)     Temp Source 07/13/20 1142 Oral     SpO2 07/13/20 1142 100 %     Weight --      Height --      Head Circumference --      Peak Flow --      Pain Score 07/13/20 1141 0     Pain Loc --      Pain Edu? --  Excl. in GC? --    No data found.  Updated Vital Signs BP (!) 136/98 (BP Location: Right Arm)   Pulse 88   Temp 98.4 F (36.9 C) (Oral)   Resp 17   SpO2 100%   Physical Exam Vitals and nursing note reviewed.  Constitutional:      General: He is not in acute distress.    Appearance: Normal appearance. He is not ill-appearing, toxic-appearing or diaphoretic.  HENT:     Head: Normocephalic and atraumatic.  Cardiovascular:     Rate and Rhythm: Normal rate and regular rhythm.  Pulmonary:     Effort: Pulmonary effort is normal.     Breath sounds: Normal breath sounds.  Skin:    General: Skin is warm.  Neurological:     Mental Status: He is alert.     UC Treatments / Results  Labs (all labs ordered are listed, but only abnormal results are displayed) Labs Reviewed - No data to display  EKG   Radiology No results found.  Procedures Procedures (including critical care time)  Medications Ordered in UC Medications - No  data to display  Initial Impression / Assessment and Plan / UC Course  I have reviewed the triage vital signs and the nursing notes.  Pertinent labs & imaging results that were available during my care of the patient were reviewed by me and considered in my medical decision making (see chart for details).     New. Acutely elevated as he is off of his Lisinopril. Sending in refill to the pharmacy for him. Encouraged PCP follow up.    Final Clinical Impressions(s) / UC Diagnoses   Final diagnoses:  None   Discharge Instructions   None    ED Prescriptions   None    PDMP not reviewed this encounter.   Rushie Chestnut, [place] 07/13/20 1213

## 2020-10-06 ENCOUNTER — Ambulatory Visit: Payer: 59 | Attending: Physician Assistant | Admitting: Physician Assistant

## 2020-10-06 ENCOUNTER — Encounter: Payer: Self-pay | Admitting: Physician Assistant

## 2020-10-06 ENCOUNTER — Other Ambulatory Visit: Payer: Self-pay

## 2020-10-06 VITALS — BP 123/79 | HR 84 | Ht 67.0 in | Wt 178.1 lb

## 2020-10-06 DIAGNOSIS — I10 Essential (primary) hypertension: Secondary | ICD-10-CM | POA: Diagnosis not present

## 2020-10-06 DIAGNOSIS — M545 Low back pain, unspecified: Secondary | ICD-10-CM | POA: Diagnosis not present

## 2020-10-06 DIAGNOSIS — G8929 Other chronic pain: Secondary | ICD-10-CM

## 2020-10-06 MED ORDER — LISINOPRIL 20 MG PO TABS
20.0000 mg | ORAL_TABLET | Freq: Every day | ORAL | 0 refills | Status: DC
  Filled 2020-10-06: qty 30, 30d supply, fill #0

## 2020-10-06 MED ORDER — IBUPROFEN 600 MG PO TABS
600.0000 mg | ORAL_TABLET | Freq: Four times a day (QID) | ORAL | 0 refills | Status: AC | PRN
  Filled 2020-10-06: qty 60, 15d supply, fill #0

## 2020-10-06 NOTE — Progress Notes (Signed)
Patient ID: Kenneth Wilkerson, male   DOB: [birth year], 60 y.o.   MRN: [id number]   Kenneth Wilkerson, is a 60 y.o. male  TKZ:[id number]  TDD:[id number]  DOB - 1960-12-07  Chief Complaint  Patient presents with   Hypertension       Subjective:   Kenneth Wilkerson is a 60 y.o. male here today for med RF.  He has an upcoming appt with Dr Delford Field in October and refuses labs today!  Only taking lisinopril and ibuprofen for occasional back pain.   Patient has No headache, No chest pain, No abdominal pain - No Nausea, No new weakness tingling or numbness, No Cough - SOB.  No problems updated.  ALLERGIES: Allergies  Allergen Reactions   Amoxicillin Other (See Comments)    Shaking/ tremors   Darvocet [Propoxyphene N-Acetaminophen] Other (See Comments)    HA, NERVOUSNESS   Sulfa Antibiotics Other (See Comments)    Dizziness, shakey, sweats    PAST MEDICAL HISTORY: Past Medical History:  Diagnosis Date   Elevated bilirubin 04/07/2013   Erythrocytosis 04/07/2013   Hemorrhoids    Hyperlipidemia    Hypertension    Tuberculosis    Treated for in 2000   Vitamin D deficiency     MEDICATIONS AT HOME: Prior to Admission medications   Medication Sig Start Date End Date Taking? Authorizing Provider  HYDROcodone-acetaminophen (NORCO/VICODIN) 5-325 MG tablet Take 1-2 tablets by mouth every 6 (six) hours as needed for moderate pain. 04/22/20  Yes Domenick Gong, MD  ibuprofen (ADVIL) 600 MG tablet Take 1 tablet (600 mg total) by mouth every 6 (six) hours as needed. 10/06/20   Anders Simmonds, PA-C  lisinopril (ZESTRIL) 20 MG tablet Take 1 tablet (20 mg total) by mouth daily. 10/06/20   Delylah Stanczyk, Marzella Schlein, PA-C    ROS: Neg HEENT Neg resp Neg cardiac Neg GI Neg GU Neg MS Neg psych Neg neuro  Objective:   Vitals:   10/06/20 1534  BP: 123/79  Pulse: 84  SpO2: 98%  Weight: 178 lb 2 oz (80.8 kg)  Height: 5\' 7"  (1.702 m)   Exam General appearance : Awake, alert, not in  any distress. Speech Clear. Not toxic looking HEENT: Atraumatic and Normocephalic Neck: Supple, no JVD. No cervical lymphadenopathy.  Chest: Good air entry bilaterally, CTAB.  No rales/rhonchi/wheezing CVS: S1 S2 regular, no murmurs.  Extremities: B/L Lower Ext shows no edema, both legs are warm to touch Neurology: Awake alert, and oriented X 3, CN II-XII intact, Non focal Skin: No Rash  Data Review Lab Results  Component Value Date   HGBA1C 4.9 10/14/2017    Assessment & Plan   1. Essential hypertension Controlled.   - lisinopril (ZESTRIL) 20 MG tablet; Take 1 tablet (20 mg total) by mouth daily.  Dispense: 90 tablet; Refill: 0  2. Chronic bilateral low back pain without sciatica - ibuprofen (ADVIL) 600 MG tablet; Take 1 tablet (600 mg total) by mouth every 6 (six) hours as needed.  Dispense: 60 tablet; Refill: 0  Labs next visit  Patient have been counseled extensively about nutrition and exercise. Other issues discussed during this visit include: low cholesterol diet, weight control and daily exercise, foot care, annual eye examinations at Ophthalmology, importance of adherence with medications and regular follow-up. We also discussed long term complications of uncontrolled diabetes and hypertension.   Return for keep appt with dr 10/16/2017 next month.  The patient was given clear instructions to go to ER or return to medical center if symptoms  don't improve, worsen or new problems develop. The patient verbalized understanding. The patient was told to call to get lab results if they haven't heard anything in the next week.      Georgian Co, PA-C [facility] [place], [place] [phone]   10/06/2020, 3:50 PM

## 2020-11-08 ENCOUNTER — Other Ambulatory Visit: Payer: Self-pay

## 2020-11-08 ENCOUNTER — Ambulatory Visit: Payer: 59 | Attending: Critical Care Medicine | Admitting: Critical Care Medicine

## 2020-11-08 ENCOUNTER — Encounter: Payer: Self-pay | Admitting: Critical Care Medicine

## 2020-11-08 DIAGNOSIS — J209 Acute bronchitis, unspecified: Secondary | ICD-10-CM | POA: Diagnosis not present

## 2020-11-08 DIAGNOSIS — I10 Essential (primary) hypertension: Secondary | ICD-10-CM | POA: Diagnosis not present

## 2020-11-08 DIAGNOSIS — R17 Unspecified jaundice: Secondary | ICD-10-CM

## 2020-11-08 MED ORDER — BENZONATATE 100 MG PO CAPS: 100.0000 mg | ORAL_CAPSULE | Freq: Three times a day (TID) | ORAL | 0 refills | Status: DC | PRN

## 2020-11-08 MED ORDER — PREDNISONE 10 MG PO TABS: ORAL_TABLET | ORAL | 0 refills | Status: DC

## 2020-11-08 MED ORDER — FLUTICASONE PROPIONATE 50 MCG/ACT NA SUSP: 2.0000 | Freq: Every day | NASAL | 6 refills | Status: AC

## 2020-11-08 MED ORDER — AZITHROMYCIN 250 MG PO TABS: ORAL_TABLET | ORAL | 0 refills | Status: DC

## 2020-11-08 MED ORDER — LISINOPRIL 20 MG PO TABS: 20.0000 mg | ORAL_TABLET | Freq: Every day | ORAL | 3 refills | Status: DC

## 2020-11-08 NOTE — Assessment & Plan Note (Signed)
Acute bronchitis based on history without ability to perform a direct examination for this patient.  Plan will be to administer azithromycin for 5 days pulse prednisone 40 mg a day for 5 days and discontinue begin Flonase 2 sprays each nostril daily and as well give Tessalon Perles as needed for cough

## 2020-11-08 NOTE — Assessment & Plan Note (Signed)
Hypertension stable based on last blood pressure at the last visit we will refill lisinopril for now I do not believe is contributing to his cough we will bring the patient in for labs and a direct exam in the next 2 weeks

## 2020-11-08 NOTE — Progress Notes (Signed)
New Patient Office Visit  Subjective:  Patient ID: ANTOIN Wilkerson, male    DOB: Aug 15, 1960  Age: 60 y.o. MRN: [id number] Virtual Visit via Telephone Note  I connected with Kenneth Wilkerson on 11/08/20 at  3:30 PM EDT by telephone and verified that I am speaking with the correct person using two identifiers.   Consent:  I discussed the limitations, risks, security and privacy concerns of performing an evaluation and management service by telephone and the availability of in person appointments. I also discussed with the patient that there may be a patient responsible charge related to this service. The patient expressed understanding and agreed to proceed.  Location of patient: Patient is at home   Location of provider: I am in my office  Persons participating in the televisit with the patient.   Ephriam Knuckles Spanish interpreter [phone] was on the call no one else was on the call  History of Present Illness:     CC: Primary care follow-up visit to establish and 2 weeks worth of cough and chest congestion wheezing chest pain   HPI Kenneth Wilkerson presents for primary care follow-up and has a chief complaint today of cough and congestion for 2 weeks affecting the left side of his chest.  He is coughing up thick green mucus blowing out green mucus he has nasal drainage sinus pain sinus pressure sneezing wheezing chest discomfort chest tightness.  He does not currently smoke at this time.  He did have COVID in May of this year did fully recover.  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  He is divorced lives here in [place] since 1997 from [place] he has a wife and children back in [place] he lives with his girlfriend.  History of hypertension hyperlipidemia shingles in the past.  Also vitamin D deficiency.  He is in need of a flu vaccine but declines that at this visit.  He does take lisinopril daily 20 mg and this has been  controlling his blood pressure well.  He had no cough or respiratory difficulty prior to this infection he currently has the chest.   Past Medical History:  Diagnosis Date   Elevated bilirubin 04/07/2013   Erythrocytosis 04/07/2013   Hemorrhoids    Hyperlipidemia    Hypertension    Tuberculosis    Treated for in 2000   Vitamin D deficiency     Past Surgical History:  Procedure Laterality Date   HEMORRHOID SURGERY     HERNIA REPAIR Bilateral 12/2018    Family History  Family history unknown: Yes    Social History   Socioeconomic History   Marital status: Legally Separated    Spouse name: Not on file   Number of children: 5   Years of education: 5th grade   Highest education level: Not on file  Occupational History   Occupation: Equities trader: [employer]  Tobacco Use   Smoking status: Never   Smokeless tobacco: Never  Vaping Use   Vaping Use: Never used  Substance and Sexual Activity   Alcohol use: Yes    Alcohol/week: 3.0 - 5.0 standard drinks    Types: 3 Cans of beer per week    Comment: occ 3 times a week   Drug use: No   Sexual activity: Never  Other Topics Concern   Not on file  Social History Narrative   Lives with his girlfriend.  His wife (they  are separated) and 5 children live in [place].   He is from [place], [place].  Arrived in the [place] in 1997.   Social Determinants of Health   Financial Resource Strain: Not on file  Food Insecurity: Not on file  Transportation Needs: Not on file  Physical Activity: Not on file  Stress: Not on file  Social Connections: Not on file  Intimate Partner Violence: Not on file    ROS Review of Systems  Constitutional:  Negative for chills, diaphoresis and fever.  HENT:  Positive for congestion, postnasal drip, rhinorrhea, sinus pressure, sinus pain and sneezing. Negative for hearing loss, nosebleeds, sore throat and tinnitus.   Eyes:  Negative for photophobia and redness.  Respiratory:   Positive for cough, chest tightness, shortness of breath and wheezing. Negative for stridor.   Cardiovascular:  Positive for chest pain. Negative for palpitations and leg swelling.       Rad to left arm  Gastrointestinal:  Negative for abdominal pain, blood in stool, constipation, diarrhea, nausea and vomiting.  Endocrine: Negative for polydipsia.  Genitourinary:  Negative for dysuria, flank pain, frequency, hematuria and urgency.  Musculoskeletal:  Negative for back pain, myalgias and neck pain.  Skin:  Negative for rash.  Allergic/Immunologic: Negative for environmental allergies.  Neurological:  Negative for dizziness, tremors, seizures, weakness and headaches.  Hematological:  Does not bruise/bleed easily.  Psychiatric/Behavioral:  Negative for suicidal ideas. The patient is not nervous/anxious.    Objective:   Today's Vitals: There were no vitals taken for this visit.  Physical Exam No exam this is a phone visit Assessment & Plan:   Problem List Items Addressed This Visit       Cardiovascular and Mediastinum   Essential hypertension    Hypertension stable based on last blood pressure at the last visit we will refill lisinopril for now I do not believe is contributing to his cough we will bring the patient in for labs and a direct exam in the next 2 weeks      Relevant Medications   lisinopril (ZESTRIL) 20 MG tablet     Respiratory   Acute bronchitis - Primary    Acute bronchitis based on history without ability to perform a direct examination for this patient.  Plan will be to administer azithromycin for 5 days pulse prednisone 40 mg a day for 5 days and discontinue begin Flonase 2 sprays each nostril daily and as well give Tessalon Perles as needed for cough        Other   Elevated bilirubin    Will need follow-up metabolic profile and blood counts given his history of elevated bilirubin and erythrocytosis       Outpatient Encounter Medications as of 11/08/2020   Medication Sig   azithromycin (ZITHROMAX) 250 MG tablet Take two once then one daily until gone   benzonatate (TESSALON) 100 MG capsule Take 1 capsule (100 mg total) by mouth 3 (three) times daily as needed for cough.   fluticasone (FLONASE) 50 MCG/ACT nasal spray Place 2 sprays into both nostrils daily.   predniSONE (DELTASONE) 10 MG tablet Take 4 tablets daily for 5 days then stop   ibuprofen (ADVIL) 600 MG tablet Take 1 tablet (600 mg total) by mouth every 6 (six) hours as needed.   lisinopril (ZESTRIL) 20 MG tablet Take 1 tablet (20 mg total) by mouth daily.   [DISCONTINUED] HYDROcodone-acetaminophen (NORCO/VICODIN) 5-325 MG tablet Take 1-2 tablets by mouth every 6 (six) hours as needed for moderate pain.   [  DISCONTINUED] lisinopril (ZESTRIL) 20 MG tablet Take 1 tablet (20 mg total) by mouth daily. (Patient not taking: Reported on 11/08/2020)   No facility-administered encounter medications on file as of 11/08/2020.    Follow-up: Return in about 1 week (around 11/15/2020).  Follow Up Instructions: The patient knows a antibiotic will be sent and azithromycin along with pulse prednisone nasal steroid and cough suppressant to his [facility].  Patiently brought in in short-term follow-up in the next 2 weeks for a direct physical exam and lab work will be obtained at that visit.  He will also have refills on his lisinopril sent into his pharmacy   I discussed the assessment and treatment plan with the patient. The patient was provided an opportunity to ask questions and all were answered. The patient agreed with the plan and demonstrated an understanding of the instructions.   The patient was advised to call back or seek an in-person evaluation if the symptoms worsen or if the condition fails to improve as anticipated.  I provided 31 minutes of non-face-to-face time during this encounter  including  median intraservice time , review of notes, labs, imaging, medications  and explaining  diagnosis and management to the patient .    Shan Levans, MD

## 2020-11-08 NOTE — Assessment & Plan Note (Signed)
Will need follow-up metabolic profile and blood counts given his history of elevated bilirubin and erythrocytosis

## 2021-04-13 IMAGING — CT CT HEAD W/O CM
3 series · 15 of 47 positions shown, 18 images · non-contrast
Comparison: None.

CLINICAL DATA: Headache

EXAM:
CT HEAD WITHOUT CONTRAST
TECHNIQUE: Contiguous axial images were obtained from the base of the skull
through the vertex without intravenous contrast.

[Series 3: head 5.0 h30s · axial · 0.46mm/px · z∈[-78,+47]mm · 9 of 31 slices shown, 12 images]
[im 3/31  brain]
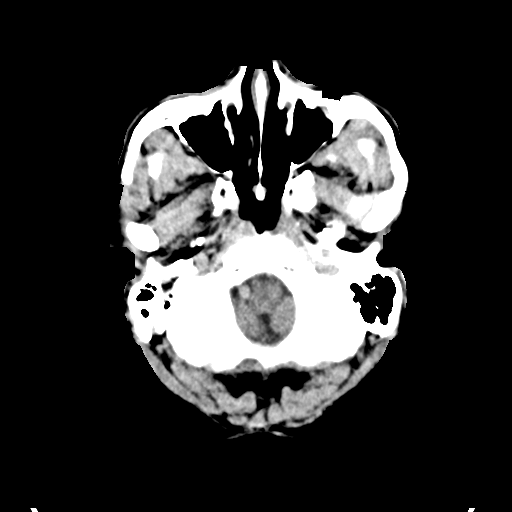
[im 3/31  bone]
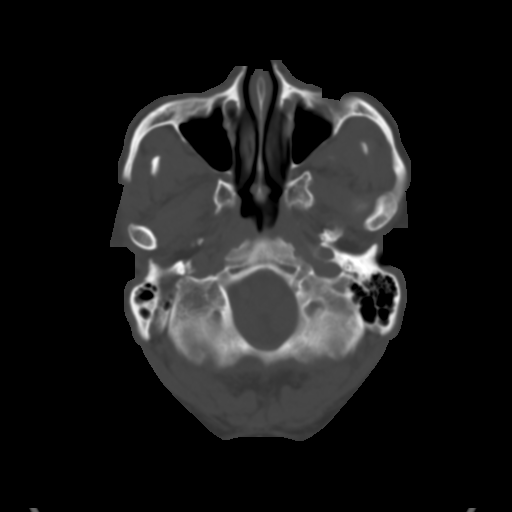
[im 6/31  brain]
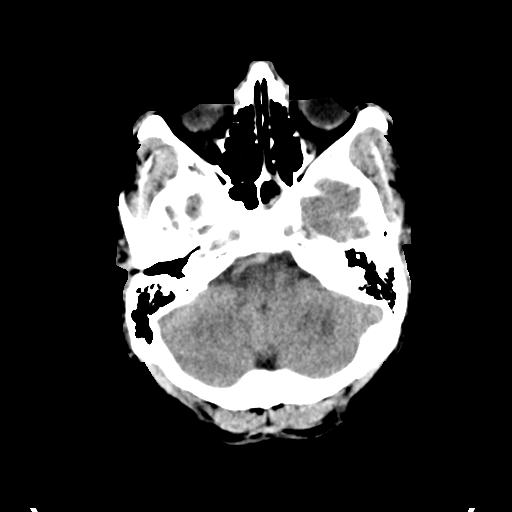
[im 9/31  brain]
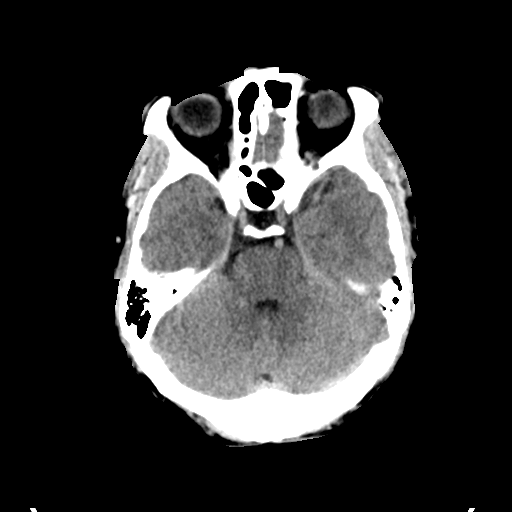
[im 12/31  brain]
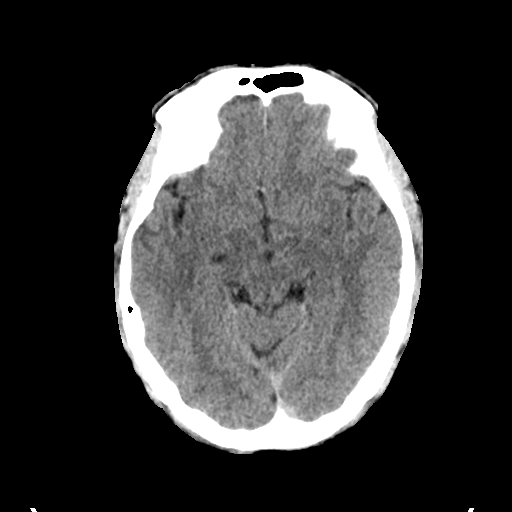
[im 16/31  brain]
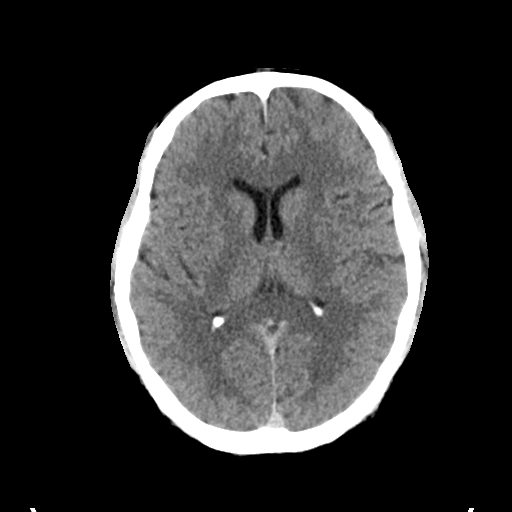
[im 16/31  bone]
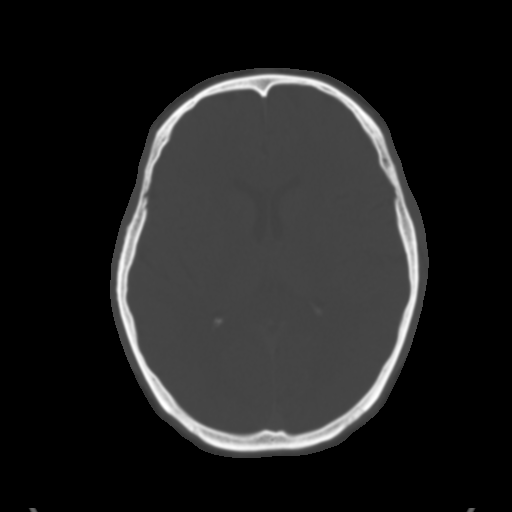
[im 19/31  brain]
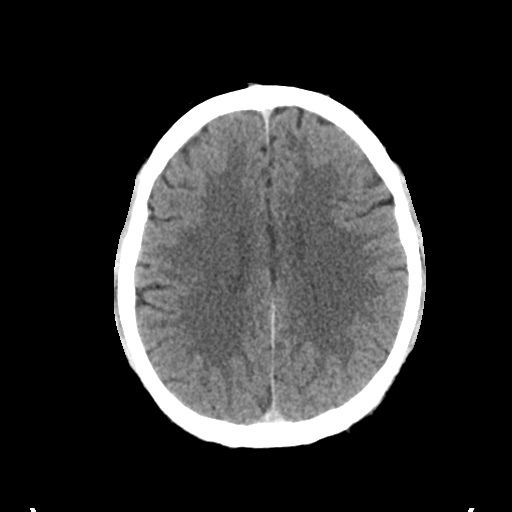
[im 22/31  brain]
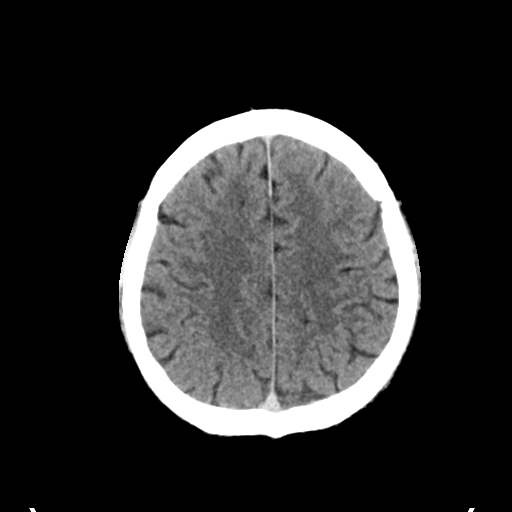
[im 25/31  brain]
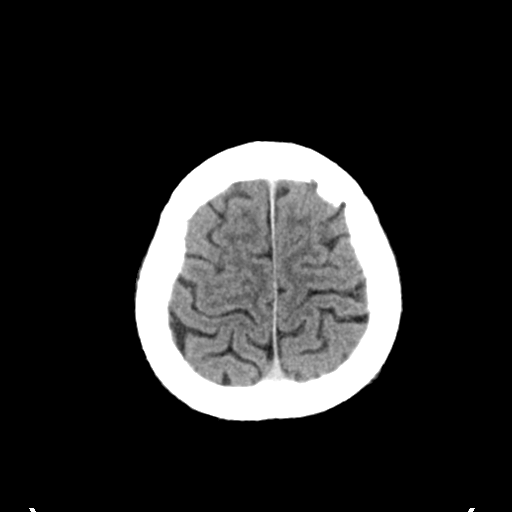
[im 28/31  brain]
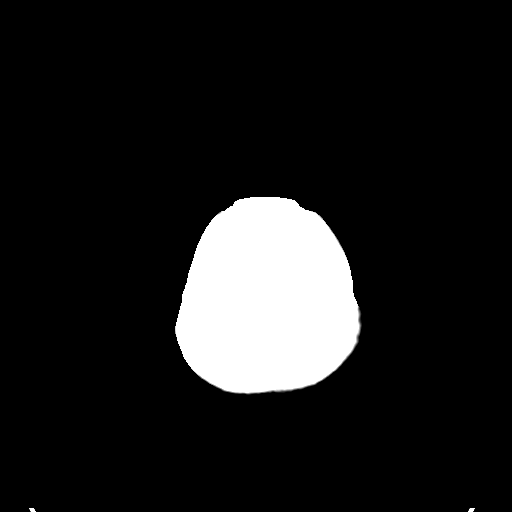
[im 28/31  bone]
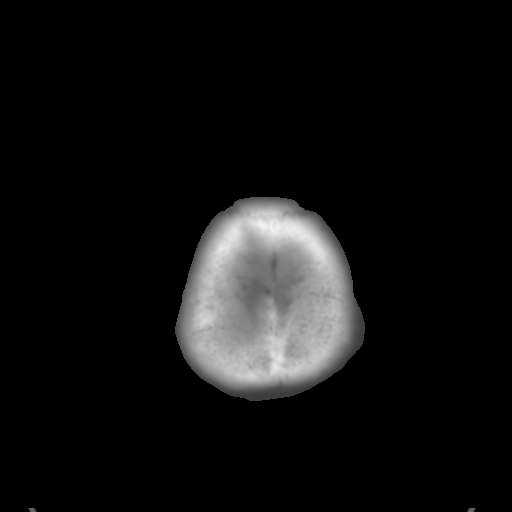

[Series 5: head 3.0 mpr cor · coronal · 0.29mm/px · 3 of 71 slices shown]
[im 24/71  brain]
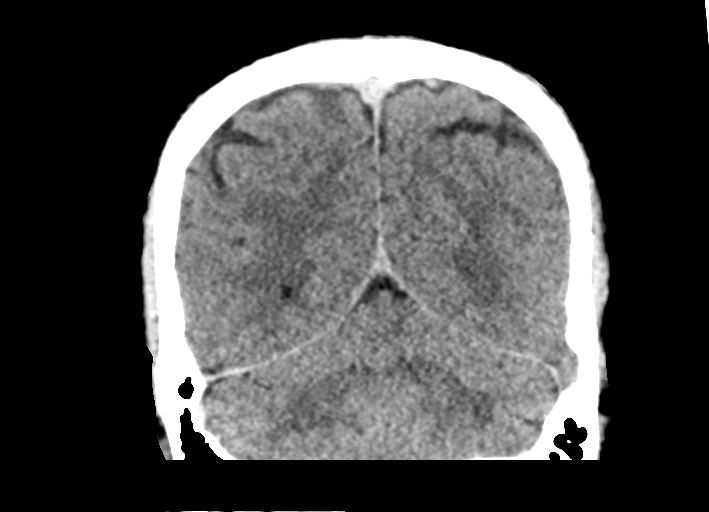
[im 32/71  brain]
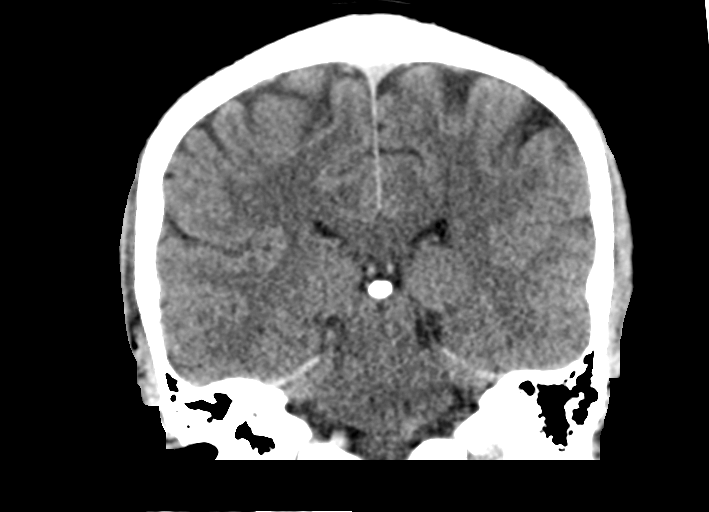
[im 39/71  brain]
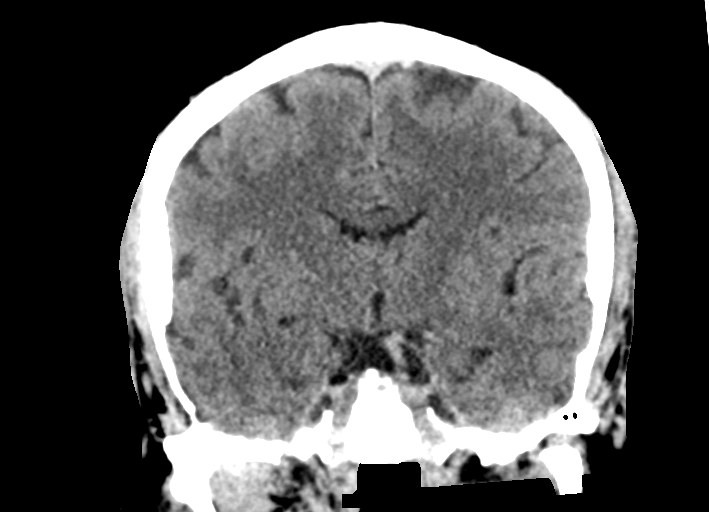

[Series 6: head 3.0 mpr sag · sagittal · 0.29mm/px · 3 of 64 slices shown]
[im 22/64  brain]
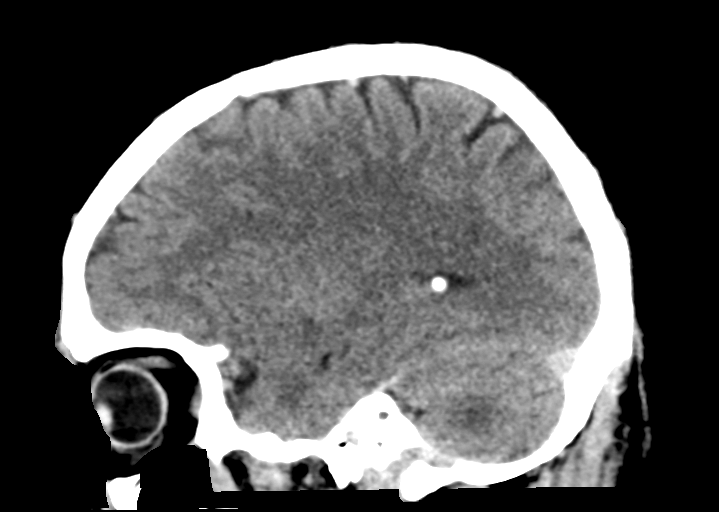
[im 32/64  brain]
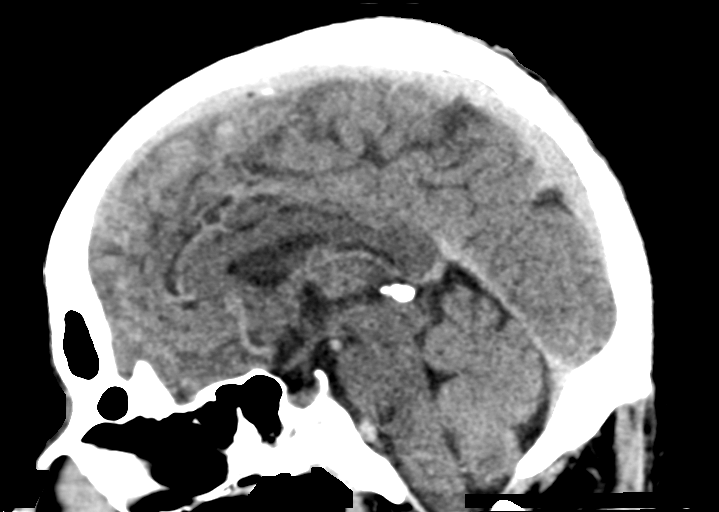
[im 43/64  brain]
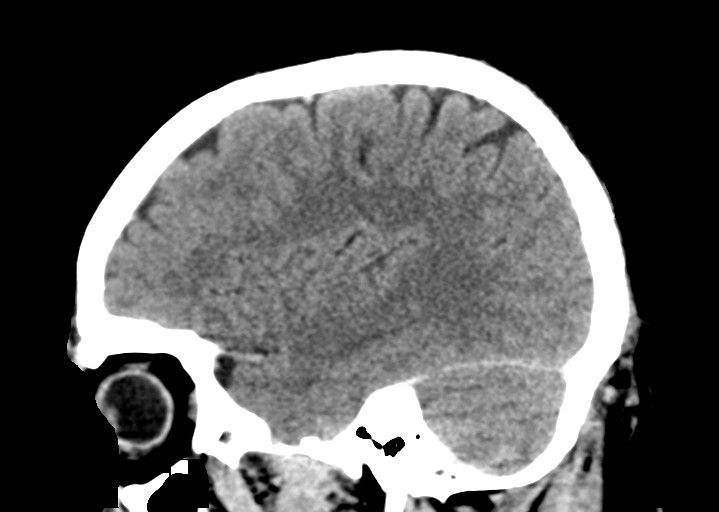

[15 of 47 positions shown; findings below may reference images not displayed]

FINDINGS: Brain: No acute intracranial abnormality. Specifically, no
hemorrhage, hydrocephalus, mass lesion, acute infarction, or
significant intracranial injury.

Vascular: No hyperdense vessel or unexpected calcification.

Skull: No acute calvarial abnormality.

Sinuses/Orbits: No acute findings

Other: None
IMPRESSION: Normal study.

## 2021-11-05 ENCOUNTER — Emergency Department (HOSPITAL_COMMUNITY)
Admission: EM | Admit: 2021-11-05 | Discharge: 2021-11-06 | Disposition: A | Discharge status: Home / Self Care | Payer: Commercial Managed Care - PPO | Attending: Emergency Medicine | Admitting: Emergency Medicine

## 2021-11-05 ENCOUNTER — Encounter (HOSPITAL_COMMUNITY): Payer: Self-pay | Admitting: Emergency Medicine

## 2021-11-05 ENCOUNTER — Emergency Department (HOSPITAL_COMMUNITY): Payer: Commercial Managed Care - PPO

## 2021-11-05 ENCOUNTER — Other Ambulatory Visit: Payer: Self-pay

## 2021-11-05 DIAGNOSIS — Z79899 Other long term (current) drug therapy: Secondary | ICD-10-CM | POA: Insufficient documentation

## 2021-11-05 DIAGNOSIS — I639 Cerebral infarction, unspecified: Secondary | ICD-10-CM | POA: Diagnosis not present

## 2021-11-05 DIAGNOSIS — H538 Other visual disturbances: Secondary | ICD-10-CM | POA: Insufficient documentation

## 2021-11-05 DIAGNOSIS — I10 Essential (primary) hypertension: Secondary | ICD-10-CM | POA: Insufficient documentation

## 2021-11-05 DIAGNOSIS — R519 Headache, unspecified: Secondary | ICD-10-CM | POA: Diagnosis not present

## 2021-11-05 DIAGNOSIS — R202 Paresthesia of skin: Secondary | ICD-10-CM | POA: Insufficient documentation

## 2021-11-05 LAB — I-STAT CHEM 8, ED
BUN: 14 mg/dL (ref 6–20)
Calcium, Ion: 1.21 mmol/L (ref 1.15–1.40)
Chloride: 98 mmol/L (ref 98–111)
Creatinine, Ser: 0.7 mg/dL (ref 0.61–1.24)
Glucose, Bld: 169 mg/dL — ABNORMAL HIGH (ref 70–99)
HCT: 52 % (ref 39.0–52.0)
Hemoglobin: 17.7 g/dL — ABNORMAL HIGH (ref 13.0–17.0)
Potassium: 3.9 mmol/L (ref 3.5–5.1)
Sodium: 136 mmol/L (ref 135–145)
TCO2: 26 mmol/L (ref 22–32)

## 2021-11-05 LAB — COMPREHENSIVE METABOLIC PANEL
ALT: 30 U/L (ref 0–44)
AST: 31 U/L (ref 15–41)
Albumin: 4.1 g/dL (ref 3.5–5.0)
Alkaline Phosphatase: 76 U/L (ref 38–126)
Anion gap: 8 (ref 5–15)
BUN: 12 mg/dL (ref 6–20)
CO2: 29 mmol/L (ref 22–32)
Calcium: 9.5 mg/dL (ref 8.9–10.3)
Chloride: 97 mmol/L — ABNORMAL LOW (ref 98–111)
Creatinine, Ser: 0.71 mg/dL (ref 0.61–1.24)
GFR, Estimated: >60 mL/min (ref >60)
Glucose, Bld: 126 mg/dL — ABNORMAL HIGH (ref 70–99)
Potassium: 4.1 mmol/L (ref 3.5–5.1)
Sodium: 134 mmol/L — ABNORMAL LOW (ref 135–145)
Total Bilirubin: 0.5 mg/dL (ref 0.3–1.2)
Total Protein: 8 g/dL (ref 6.5–8.1)

## 2021-11-05 LAB — CBC WITH DIFFERENTIAL/PLATELET
Abs Immature Granulocytes: 0.02 10*3/uL (ref 0.00–0.07)
Basophils Absolute: 0 10*3/uL (ref 0.0–0.1)
Basophils Relative: 0 %
Eosinophils Absolute: 0.1 10*3/uL (ref 0.0–0.5)
Eosinophils Relative: 2 %
HCT: 48.6 % (ref 39.0–52.0)
Hemoglobin: 16.9 g/dL (ref 13.0–17.0)
Immature Granulocytes: 0 %
Lymphocytes Relative: 43 %
Lymphs Abs: 3 10*3/uL (ref 0.7–4.0)
MCH: 32.3 pg (ref 26.0–34.0)
MCHC: 34.8 g/dL (ref 30.0–36.0)
MCV: 92.9 fL (ref 80.0–100.0)
Monocytes Absolute: 0.6 10*3/uL (ref 0.1–1.0)
Monocytes Relative: 8 %
Neutro Abs: 3.1 10*3/uL (ref 1.7–7.7)
Neutrophils Relative %: 47 %
Platelets: 242 10*3/uL (ref 150–400)
RBC: 5.23 MIL/uL (ref 4.22–5.81)
RDW: 12.7 % (ref 11.5–15.5)
WBC: 6.9 10*3/uL (ref 4.0–10.5)
nRBC: 0 % (ref 0.0–0.2)

## 2021-11-05 LAB — PROTIME-INR
INR: 1.1 (ref 0.8–1.2)
Prothrombin Time: 14.3 seconds (ref 11.4–15.2)

## 2021-11-05 LAB — ETHANOL: Alcohol, Ethyl (B): <10 mg/dL (ref <10)

## 2021-11-05 LAB — APTT: aPTT: 31 seconds (ref 24–36)

## 2021-11-05 NOTE — ED Provider Triage Note (Signed)
Emergency Medicine Provider Triage Evaluation Note  Kenneth Wilkerson , a 61 y.o. male  was evaluated in triage.  Pt complains of headache, heaviness in his left eye.  He states he woke up this morning around 7 AM noticed a headache.  As patient was driving this morning he noticed heaviness in the left eye.  Denies any vision loss.  He reports paresthesias to his left upper extremity.  Denies any weakness, difficulty with speech.  Patient has history of hypertension, hyperlipidemia.  Reports potential seizure/syncopal episode last year.  Review of Systems  Positive: As above Negative: As above  Physical Exam  BP (!) 142/99   Pulse 94   Temp 98.6 F (37 C)   Resp 14   SpO2 97%  Gen:   Awake, no distress   Resp:  Normal effort  MSK:   Moves extremities without difficulty  Other:  Cranial nerves III through XII intact.  Full range of motion bilateral upper and lower extremities.  Sensation intact and symmetrical bilaterally.  Without pronator drift.  Tongue midline.  Pupils equal round and reactive to light.  Medical Decision Making  Medically screening exam initiated at 1:03 PM.  Appropriate orders placed.  Kenneth Wilkerson was informed that the remainder of the evaluation will be completed by another provider, this initial triage assessment does not replace that evaluation, and the importance of remaining in the ED until their evaluation is complete.     Evlyn Courier, PA-C 11/05/21 1305

## 2021-11-05 NOTE — ED Provider Notes (Signed)
[facility] EMERGENCY DEPARTMENT Provider Note   CSN: [id number] Arrival date & time: 11/05/21  1239     History  Chief Complaint  Patient presents with   Blurred Vision    Kenneth Wilkerson is a 61 y.o. male.  61 year old male with past medical history of hypertension, hyperlipidemia presents with complaint of frontal headache onset 7 AM while at work, later developed vision loss in his left eye, watering of the left eye, followed by numbness of the left side of his face and left arm.  Patient states that he was having a little bit of a hard time finding his words asking his boss to come to the emergency room but is unsure if that was related to anxiety, he has not had no further speech difficulty since.  States that while waiting, headache has improved although still somewhat present, visual disturbance has resolved, continues to have slight numbness in the left side of face and left arm although improved compared to prior.  No difficulty ambulating.  States that a year or so ago, he fell to the ground and had a seizure, is not on medication for seizures, has had no further episodes.       Home Medications Prior to Admission medications   Medication Sig Start Date End Date Taking? Authorizing Provider  azithromycin (ZITHROMAX) 250 MG tablet Take two once then one daily until gone 11/08/20   Elsie Stain, MD  benzonatate (TESSALON) 100 MG capsule Take 1 capsule (100 mg total) by mouth 3 (three) times daily as needed for cough. 11/08/20   Elsie Stain, MD  fluticasone (FLONASE) 50 MCG/ACT nasal spray Place 2 sprays into both nostrils daily. 11/08/20   Elsie Stain, MD  ibuprofen (ADVIL) 600 MG tablet Take 1 tablet (600 mg total) by mouth every 6 (six) hours as needed. 10/06/20   Argentina Donovan, PA-C  lisinopril (ZESTRIL) 20 MG tablet Take 1 tablet (20 mg total) by mouth daily. 11/08/20   Elsie Stain, MD  predniSONE (DELTASONE) 10 MG tablet  Take 4 tablets daily for 5 days then stop 11/08/20   Elsie Stain, MD      Allergies    Amoxicillin, Darvocet [propoxyphene n-acetaminophen], and Sulfa antibiotics    Review of Systems   Review of Systems Negative except as per HPI Physical Exam Updated Vital Signs BP 119/87 (BP Location: Left Arm)   Pulse 79   Temp 98.9 F (37.2 C) (Oral)   Resp 16   SpO2 98%  Physical Exam Vitals and nursing note reviewed.  Constitutional:      General: He is not in acute distress.    Appearance: He is well-developed. He is not diaphoretic.  HENT:     Head: Normocephalic and atraumatic.     Mouth/Throat:     Mouth: Mucous membranes are moist.  Eyes:     Extraocular Movements: Extraocular movements intact.     Pupils: Pupils are equal, round, and reactive to light.  Cardiovascular:     Rate and Rhythm: Normal rate and regular rhythm.     Pulses: Normal pulses.     Heart sounds: Normal heart sounds.  Pulmonary:     Effort: Pulmonary effort is normal.     Breath sounds: Normal breath sounds.  Abdominal:     Palpations: Abdomen is soft.     Tenderness: There is no abdominal tenderness.  Musculoskeletal:        General: Normal range of motion.  Skin:    General: Skin is warm and dry.     Findings: No erythema or rash.  Neurological:     Mental Status: He is alert and oriented to person, place, and time.     Cranial Nerves: No cranial nerve deficit.     Sensory: Sensory deficit present.     Motor: No weakness or pronator drift.     Coordination: Coordination normal. Finger-Nose-Finger Test and Heel to [facility] Test normal. Rapid alternating movements normal.     Gait: Gait normal.     Comments: Reports diminished sensation to left side face and left arm  Psychiatric:        Behavior: Behavior normal.     ED Results / Procedures / Treatments   Labs (all labs ordered are listed, but only abnormal results are displayed) Labs Reviewed  COMPREHENSIVE METABOLIC PANEL - Abnormal;  Notable for the following components:      Result Value   Sodium 134 (*)    Chloride 97 (*)    Glucose, Bld 126 (*)    All other components within normal limits  I-STAT CHEM 8, ED - Abnormal; Notable for the following components:   Glucose, Bld 169 (*)    Hemoglobin 17.7 (*)    All other components within normal limits  CBC WITH DIFFERENTIAL/PLATELET  ETHANOL  PROTIME-INR  APTT  RAPID URINE DRUG SCREEN, HOSP PERFORMED  URINALYSIS, ROUTINE W REFLEX MICROSCOPIC    EKG EKG Interpretation  Date/Time:  Sunday November 05 2021 23:35:55 EDT Ventricular Rate:  72 PR Interval:  152 QRS Duration: 88 QT Interval:  390 QTC Calculation: 427 R Axis:   31 Text Interpretation: Normal sinus rhythm  ST changes similar to earlier in the day Confirmed by Pricilla Loveless [phone]) on 11/05/2021 11:38:15 PM  Radiology MR BRAIN WO CONTRAST  Result Date: 11/05/2021 CLINICAL DATA:  Headache EXAM: MRI HEAD WITHOUT CONTRAST TECHNIQUE: Multiplanar, multiecho pulse sequences of the brain and surrounding structures were obtained without intravenous contrast. COMPARISON:  None Available. FINDINGS: Brain: No acute infarct, mass effect or extra-axial collection. No acute or chronic hemorrhage. Normal white matter signal, parenchymal volume and CSF spaces. The midline structures are normal. Vascular: Major flow voids are preserved. Skull and upper cervical spine: Normal calvarium and skull base. Visualized upper cervical spine and soft tissues are normal. Sinuses/Orbits:No paranasal sinus fluid levels or advanced mucosal thickening. No mastoid or middle ear effusion. Normal orbits. IMPRESSION: Normal brain MRI. Electronically Signed   By: Deatra Robinson M.D.   On: 11/05/2021 22:11   CT Head Wo Contrast  Result Date: 11/05/2021 CLINICAL DATA:  Headache, TIA EXAM: CT HEAD WITHOUT CONTRAST TECHNIQUE: Contiguous axial images were obtained from the base of the skull through the vertex without intravenous contrast.  RADIATION DOSE REDUCTION: This exam was performed according to the departmental dose-optimization program which includes automated exposure control, adjustment of the mA and/or kV according to patient size and/or use of iterative reconstruction technique. COMPARISON:  01/06/2020 FINDINGS: Brain: No evidence of acute infarction, hemorrhage, hydrocephalus, extra-axial collection or mass lesion/mass effect. Vascular: No hyperdense vessel or unexpected calcification. Skull: Normal. Negative for fracture or focal lesion. Sinuses/Orbits: No acute finding. Other: None. IMPRESSION: No acute intracranial pathology. Electronically Signed   By: Jearld Lesch M.D.   On: 11/05/2021 14:47    Procedures Procedures    Medications Ordered in ED Medications - No data to display  ED Course/ Medical Decision Making/ A&P  Medical Decision Making Amount and/or Complexity of Data Reviewed Labs: ordered. Radiology: ordered.   This patient presents to the ED for concern of headache with visual disturbance and left-sided face and arm numbness, this involves an extensive number of treatment options, and is a complaint that carries with it a high risk of complications and morbidity.  The differential diagnosis includes but not limited to CVA versus TIA versus complex migraine   Co morbidities that complicate the patient evaluation  Hypertension, hyperlipidemia, TB (treated in 2000), seizure?   Additional history obtained:  External records from outside source obtained and reviewed including prior head CT on file from 01/06/2020 unremarkable   Lab Tests:  I Ordered, and personally interpreted labs.  The pertinent results include: CBC within normal limits, CMP without significant findings.  Alcohol negative, PT PTT than normal meds.   Imaging Studies ordered:  I ordered imaging studies including CT head, MRI brain I independently visualized and interpreted imaging which showed no  acute abnormality I agree with the radiologist interpretation   Cardiac Monitoring: / EKG:  The patient was maintained on a cardiac monitor.  I personally viewed and interpreted the cardiac monitored which showed an underlying rhythm of: Sinus rhythm, rate 87   Consultations Obtained:  I requested consultation with the neurologist, Dr. Derry Lory,  and discussed lab and imaging findings as well as pertinent plan - they recommend: pending consult   Problem List / ED Course / Critical interventions / Medication management  61 year old male with past medical history of hypertension, hyperlipidemia, tuberculosis presents with concern for headache onset earlier today with visual disturbance affecting his left eye and reported facial numbness on the left side as well as left arm numbness.  Possible word finding difficulty although states he is unsure if this was related to anxiety at that time.  Upon my exam, reports still having a little bit of a headache as well as slight numbness of the left face and left arm however vision has returned to normal and the numbness is not as intense.  Other than diminished sensation, his neuro exam is unremarkable.  CT of the head is unremarkable, followed by MRI brain which is also unremarkable.  Labs are reassuring.  On recheck, numbness has completely resolved, vision is normal, does still have persistent headache. I have reviewed the patients home medicines and have made adjustments as needed   Social Determinants of Health:  Lives with wife, has PCP through [facility]   Test / Admission - Considered:  Disposition pending at time of signout pending consult with neurology.         Final Clinical Impression(s) / ED Diagnoses Final diagnoses:  None    Rx / DC Orders ED Discharge Orders     None         Jeannie Fend, PA-C 11/05/21 2344    Pricilla Loveless, MD 11/07/21 [phone]

## 2021-11-05 NOTE — ED Triage Notes (Signed)
Patient here with complaint of a headache and blurred vision started at 0700 then left hand numbness that started at approximately 1100 today. Patient is alert, oriented, and in no apparent distress at this time. No dysarthria, no facial droop, no arm drift, no leg drift, left face, left arm, left leg sensation diminished versus right.

## 2021-11-06 NOTE — ED Provider Notes (Signed)
  1:36 AM Discussed with on call neurology, Dr. Lorrin Goodell-- feels like migrainous process given associated headache.  Given reassuring MRI, feels ok to follow-up with OP neurology.  Ambulatory referral placed for neurology, also given office information.  Return here for new concerns.   Larene Pickett, PA-C 11/06/21 8850    Ripley Fraise, MD 11/06/21 [phone]

## 2021-11-06 NOTE — Discharge Instructions (Signed)
Your MRI today was normal.  We recommend that you follow-up with neurology.  I have placed referral to [facility]-- they should contact you for follow-up.  If you do not hear from them in the next 48 hours, please call and follow-up. Return to the ED for new or worsening symptoms-- focal numbness, weakness, slurred speech, etc.

## 2023-06-24 ENCOUNTER — Ambulatory Visit: Admitting: Nurse Practitioner

## 2023-06-24 ENCOUNTER — Encounter: Payer: Self-pay | Admitting: Nurse Practitioner

## 2023-06-24 VITALS — BP 130/60 | HR 89 | Temp 98.6°F | Ht 67.0 in | Wt 183.0 lb

## 2023-06-24 DIAGNOSIS — Z6828 Body mass index (BMI) 28.0-28.9, adult: Secondary | ICD-10-CM

## 2023-06-24 DIAGNOSIS — Z2821 Immunization not carried out because of patient refusal: Secondary | ICD-10-CM | POA: Diagnosis not present

## 2023-06-24 DIAGNOSIS — I10 Essential (primary) hypertension: Secondary | ICD-10-CM | POA: Diagnosis not present

## 2023-06-24 DIAGNOSIS — E663 Overweight: Secondary | ICD-10-CM | POA: Diagnosis not present

## 2023-06-24 DIAGNOSIS — Z7689 Persons encountering health services in other specified circumstances: Secondary | ICD-10-CM | POA: Diagnosis not present

## 2023-06-24 NOTE — Assessment & Plan Note (Signed)
 Blood pressure is fairly controlled, continue current medications. Will check eGFR

## 2023-06-24 NOTE — Progress Notes (Signed)
 Del Favia, CMA,acting as a Neurosurgeon for Susanna Epley, FNP.,have documented all relevant documentation on the behalf of Susanna Epley, FNP,as directed by  Susanna Epley, FNP while in the presence of Susanna Epley, FNP.  Subjective:  Patient ID: Kenneth Wilkerson , male    DOB: 1960-09-16 , 63 y.o.   MRN: [id number]  Chief Complaint  Patient presents with   Establish Care    Patient presents today to establish care, Patient reports compliance with medication. Patient denies any chest pain, SOB, or headaches. Patient has a history of hypertension.     HPI  Here to establish care. He was seeing a provider at [facility] on [address]. His last visit was last year. He is Single. He has 5 children he has 2 daughters and 3 sons - they all live in [place] - all healthy.  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 He reports he had his Colonoscopy done 3 years ago. He also reports he has had his tetanus at [facility]. Will get medical records from [place].   He has had elevated RBCs and seen a Hematologist. He reports he has not been checked for sleep apnea.   Interpreter Raquel  Hypertension This is a chronic problem. The current episode started more than 1 year ago. The problem is unchanged. The problem is controlled. Pertinent negatives include no anxiety or blurred vision. There are no associated agents to hypertension. Risk factors for coronary artery disease include male gender. Past treatments include ACE inhibitors. There are no compliance problems.  There is no history of kidney disease. There is no history of chronic renal disease.     Past Medical History:  Diagnosis Date   Elevated bilirubin 04/07/2013   Erythrocytosis 04/07/2013   Hemorrhoids    Hyperlipidemia    Hypertension    Tuberculosis    Treated for in 2000   Vitamin D  deficiency      Family History  Family history unknown: Yes     Current Outpatient Medications:    ibuprofen  (ADVIL ) 600 MG tablet, Take 1 tablet (600 mg total)  by mouth every 6 (six) hours as needed., Disp: 60 tablet, Rfl: 0   lisinopril  (ZESTRIL ) 20 MG tablet, Take 1 tablet (20 mg total) by mouth daily., Disp: 90 tablet, Rfl: 3   fluticasone  (FLONASE ) 50 MCG/ACT nasal spray, Place 2 sprays into both nostrils daily. (Patient not taking: Reported on 06/24/2023), Disp: 16 g, Rfl: 6   Allergies  Allergen Reactions   Amoxicillin  Other (See Comments)    Shaking/ tremors   Darvocet [Propoxyphene N-Acetaminophen ] Other (See Comments)    HA, NERVOUSNESS   Sulfa Antibiotics Other (See Comments)    Dizziness, shakey, sweats     Review of Systems  Constitutional: Negative.   Eyes:  Negative for blurred vision.  Respiratory: Negative.    Cardiovascular: Negative.   Genitourinary: Negative.   Musculoskeletal: Negative.   Neurological: Negative.   Psychiatric/Behavioral: Negative.       Today's Vitals   06/24/23 1438  BP: 130/60  Pulse: 89  Temp: 98.6 F (37 C)  TempSrc: Oral  Weight: 183 lb (83 kg)  Height: 5\' 7"  (1.702 m)  PainSc: 0-No pain   Body mass index is 28.66 kg/m.  Wt Readings from Last 3 Encounters:  06/24/23 183 lb (83 kg)  10/06/20 178 lb 2 oz (80.8 kg)  12/16/19 175 lb (79.4 kg)     Objective:  Physical Exam Vitals and nursing note reviewed.  Constitutional:  General: He is not in acute distress.    Appearance: Normal appearance.  Cardiovascular:     Rate and Rhythm: Normal rate and regular rhythm.     Pulses: Normal pulses.     Heart sounds: Normal heart sounds. No murmur heard. Pulmonary:     Effort: Pulmonary effort is normal. No respiratory distress.     Breath sounds: Normal breath sounds. No wheezing.  Skin:    General: Skin is warm and dry.     Capillary Refill: Capillary refill takes less than 2 seconds.  Neurological:     General: No focal deficit present.     Mental Status: He is alert and oriented to person, place, and time.     Cranial Nerves: No cranial nerve deficit.     Motor: No weakness.   Psychiatric:        Mood and Affect: Mood normal.        Behavior: Behavior normal.        Thought Content: Thought content normal.        Judgment: Judgment normal.     Assessment And Plan:  Establishing care with new doctor, encounter for Assessment & Plan: Patient is here to establish care. Went over patient medical, family, social and surgical history. Reviewed with patient their medications and any allergies  Reviewed with patient their sexual orientation, drug/tobacco and alcohol use Dicussed any new concerns with patient  recommended patient comes in for a physical exam and complete blood work.  Educated patient about the importance of annual screenings and immunizations.  Advised patient to eat a healthy diet along with exercise for atleast 30-45 min at least 4-5 days of the week.     Essential hypertension Assessment & Plan: Blood pressure is fairly controlled, continue current medications. Will check eGFR  Orders: -     BMP8+eGFR  COVID-19 vaccination declined Assessment & Plan: Declines covid 19 vaccine. Discussed risk of covid 43 and if he changes her mind about the vaccine to call the office. Education has been provided regarding the importance of this vaccine but patient still declined. Advised may receive this vaccine at local pharmacy or Health Dept.or vaccine clinic. Aware to provide a copy of the vaccination record if obtained from local pharmacy or Health Dept.  Encouraged to take multivitamin, vitamin d , vitamin c and zinc to increase immune system. Aware can call office if would like to have vaccine here at office. Verbalized acceptance and understanding.    Overweight with body mass index (BMI) of 28 to 28.9 in adult    Return in about 4 months (around 10/24/2023) for phy when able- AM.  Patient was given opportunity to ask questions. Patient verbalized understanding of the plan and was able to repeat key elements of the plan. All questions were answered  to their satisfaction.    Inge Mangle, FNP, have reviewed all documentation for this visit. The documentation on 06/24/23 for the exam, diagnosis, procedures, and orders are all accurate and complete.   IF YOU HAVE BEEN REFERRED TO A SPECIALIST, IT MAY TAKE 1-2 WEEKS TO SCHEDULE/PROCESS THE REFERRAL. IF YOU HAVE NOT HEARD FROM US /SPECIALIST IN TWO WEEKS, PLEASE GIVE US  A CALL AT [phone].

## 2023-06-24 NOTE — Assessment & Plan Note (Signed)

## 2023-06-24 NOTE — Assessment & Plan Note (Signed)

## 2023-06-25 LAB — BMP8+EGFR
BUN/Creatinine Ratio: 18 (ref 10–24)
BUN: 14 mg/dL (ref 8–27)
CO2: 20 mmol/L (ref 20–29)
Calcium: 9.6 mg/dL (ref 8.6–10.2)
Chloride: 100 mmol/L (ref 96–106)
Creatinine, Ser: 0.79 mg/dL (ref 0.76–1.27)
Glucose: 101 mg/dL — ABNORMAL HIGH (ref 70–99)
Potassium: 4.4 mmol/L (ref 3.5–5.2)
Sodium: 135 mmol/L (ref 134–144)
eGFR: 101 mL/min/{1.73_m2} (ref >59)

## 2023-07-01 ENCOUNTER — Ambulatory Visit: Payer: Self-pay | Admitting: Nurse Practitioner

## 2023-07-13 ENCOUNTER — Emergency Department (HOSPITAL_COMMUNITY)
Admission: EM | Admit: 2023-07-13 | Discharge: 2023-07-13 | Disposition: A | Discharge status: Home / Self Care | Attending: Emergency Medicine | Admitting: Emergency Medicine

## 2023-07-13 ENCOUNTER — Other Ambulatory Visit: Payer: Self-pay

## 2023-07-13 ENCOUNTER — Encounter (HOSPITAL_COMMUNITY): Payer: Self-pay

## 2023-07-13 ENCOUNTER — Emergency Department (HOSPITAL_COMMUNITY)

## 2023-07-13 DIAGNOSIS — S61213A Laceration without foreign body of left middle finger without damage to nail, initial encounter: Secondary | ICD-10-CM

## 2023-07-13 DIAGNOSIS — S6710XA Crushing injury of unspecified finger(s), initial encounter: Secondary | ICD-10-CM

## 2023-07-13 DIAGNOSIS — S67193A Crushing injury of left middle finger, initial encounter: Secondary | ICD-10-CM | POA: Insufficient documentation

## 2023-07-13 DIAGNOSIS — W232XXA Caught, crushed, jammed or pinched between a moving and stationary object, initial encounter: Secondary | ICD-10-CM | POA: Diagnosis not present

## 2023-07-13 HISTORY — DX: Essential (primary) hypertension: I10

## 2023-07-13 MED ORDER — CEPHALEXIN 500 MG PO CAPS: 500.0000 mg | ORAL_CAPSULE | Freq: Two times a day (BID) | ORAL | 0 refills | Status: AC

## 2023-07-13 NOTE — ED Provider Notes (Signed)
 [place] EMERGENCY DEPARTMENT AT [facility] Provider Note   CSN: [id number] Arrival date & time: 07/13/23  9182     Patient presents with: Finger Injury   Kenneth Wilkerson is a 63 y.o. male presenting to ED complaining of pain in his left distal third finger.  Reports that he crushed between 2 pieces of wood yesterday.  He reports there is a small laceration on the side that was bleeding and oozing.  He thinks his tetanus is up-to-date from last year.   HPI     Prior to Admission medications   Medication Sig Start Date End Date Taking? Authorizing Provider  cephALEXin  (KEFLEX ) 500 MG capsule Take 1 capsule (500 mg total) by mouth 2 (two) times daily for 7 days. 07/13/23 07/20/23 Yes Sreya Froio, Donnice PARAS, MD  lisinopril (ZESTRIL) 10 MG tablet Take 10 mg by mouth daily. 05/06/23  Yes [provider]    Allergies: Darvon [propoxyphene] and Sulfa antibiotics    Review of Systems  Updated Vital Signs BP (!) 146/85 (BP Location: Right Arm)   Pulse (!) 101   Temp 98.2 F (36.8 C) (Oral)   Resp 19   Ht 5' 7 (1.702 m)   Wt 83.9 kg   SpO2 99%   BMI 28.98 kg/m   Physical Exam Constitutional:      General: He is not in acute distress. HENT:     Head: Normocephalic and atraumatic.   Eyes:     Conjunctiva/sclera: Conjunctivae normal.     Pupils: Pupils are equal, round, and reactive to light.    Cardiovascular:     Rate and Rhythm: Normal rate and regular rhythm.  Pulmonary:     Effort: Pulmonary effort is normal. No respiratory distress.   Musculoskeletal:     Comments: Left third finger distal end with some mild discoloration, half centimeter lateral laceration alongside the nailbed, small bit of bleeding near distal rim of nail; no subungual hematoma   Skin:    General: Skin is warm and dry.   Neurological:     General: No focal deficit present.     Mental Status: He is alert. Mental status is at baseline.   Psychiatric:        Mood  and Affect: Mood normal.        Behavior: Behavior normal.     (all labs ordered are listed, but only abnormal results are displayed) Labs Reviewed - No data to display  EKG: None  Radiology: DG Finger Middle Left Result Date: 07/13/2023 CLINICAL DATA:  Crush injury to the distal finger EXAM: LEFT MIDDLE FINGER 3V COMPARISON:  None Available. FINDINGS: There is no evidence of fracture or dislocation. There is no evidence of arthropathy or other focal bone abnormality. Soft tissue swelling of the distal finger. IMPRESSION: No acute fracture or dislocation. Soft tissue swelling of the distal finger. Electronically Signed   By: Limin  Xu M.D.   On: 07/13/2023 09:08     .Laceration Repair  Date/Time: 07/13/2023 10:22 AM  Performed by: Cottie Donnice PARAS, MD Authorized by: Cottie Donnice PARAS, MD   Consent:    Consent obtained:  Verbal   Consent given by:  Patient   Risks discussed:  Pain, poor cosmetic result and poor wound healing Universal protocol:    Procedure explained and questions answered to patient or proxy's satisfaction: yes     Relevant documents present and verified: yes     Test results available: yes     Imaging studies  available: yes     Required blood products, implants, devices, and special equipment available: yes     Site/side marked: yes     Immediately prior to procedure, a time out was called: yes     Patient identity confirmed:  Arm band Anesthesia:    Anesthesia method:  None Laceration details:    Location:  Finger   Finger location:  L long finger   Length (cm):  1 Exploration:    Contaminated: no   Treatment:    Area cleansed with:  Saline   Amount of cleaning:  Standard   Irrigation solution:  Sterile saline Skin repair:    Repair method:  Tissue adhesive Approximation:    Approximation:  Loose Repair type:    Repair type:  Simple Post-procedure details:    Dressing:  Open (no dressing)   Procedure completion:  Tolerated well, no immediate  complications    Medications Ordered in the ED - No data to display                                  Medical Decision Making Amount and/or Complexity of Data Reviewed Radiology: ordered.  Risk Prescription drug management.   X-rays ordered and personally viewed interpreted, notable for no emergent findings or evident fracture  No subungual hematoma at this time to require trephination.  I suspect that the small lateral laceration permitted blood release.  Nailbed appears intact.  Wound irrigated, lateral wound dermabond applied given that the skin was friable, gaping, and I don't think amenable to suturing at this position, tetanus is up to date already.     Final diagnoses:  Laceration of left middle finger without foreign body without damage to nail, initial encounter  Crushing injury of finger, initial encounter    ED Discharge Orders          Ordered    cephALEXin  (KEFLEX ) 500 MG capsule  2 times daily        07/13/23 1016               Cyril Woodmansee, Donnice PARAS, MD 07/13/23 1023

## 2023-07-13 NOTE — ED Triage Notes (Signed)
 Patient reports yesterday afternoon smashed his left middle finger in between two pieces of wood.  Reports tetanus is UTD

## 2023-07-13 NOTE — Discharge Instructions (Signed)
 Please call to make a follow-up appointment with the orthopedic hand doctor to have your wound reexamined.  You can wash with regular soap and water.  The glue will gradually dissolve over the next few days.  I did prescribe an antibiotic that you should begin taking today to prevent infection

## 2023-07-13 NOTE — ED Notes (Signed)
Finger soaked in betadine and saline

## 2023-07-15 ENCOUNTER — Encounter: Payer: Self-pay | Admitting: Nurse Practitioner

## 2023-11-18 ENCOUNTER — Other Ambulatory Visit: Payer: Self-pay

## 2023-11-18 MED ORDER — LISINOPRIL 10 MG PO TABS: 10.0000 mg | ORAL_TABLET | Freq: Every day | ORAL | 1 refills | Status: DC

## 2023-12-12 ENCOUNTER — Encounter: Payer: Self-pay | Admitting: Nurse Practitioner

## 2023-12-12 ENCOUNTER — Ambulatory Visit (INDEPENDENT_AMBULATORY_CARE_PROVIDER_SITE_OTHER): Payer: Self-pay | Admitting: Nurse Practitioner

## 2023-12-12 VITALS — BP 110/78 | HR 85 | Temp 98.2°F | Ht 67.0 in | Wt 178.0 lb

## 2023-12-12 DIAGNOSIS — Z125 Encounter for screening for malignant neoplasm of prostate: Secondary | ICD-10-CM

## 2023-12-12 DIAGNOSIS — Z13220 Encounter for screening for lipoid disorders: Secondary | ICD-10-CM | POA: Diagnosis not present

## 2023-12-12 DIAGNOSIS — Z6827 Body mass index (BMI) 27.0-27.9, adult: Secondary | ICD-10-CM

## 2023-12-12 DIAGNOSIS — Z136 Encounter for screening for cardiovascular disorders: Secondary | ICD-10-CM

## 2023-12-12 DIAGNOSIS — I10 Essential (primary) hypertension: Secondary | ICD-10-CM | POA: Diagnosis not present

## 2023-12-12 DIAGNOSIS — Z0000 Encounter for general adult medical examination without abnormal findings: Secondary | ICD-10-CM

## 2023-12-12 DIAGNOSIS — G245 Blepharospasm: Secondary | ICD-10-CM

## 2023-12-12 DIAGNOSIS — Z2821 Immunization not carried out because of patient refusal: Secondary | ICD-10-CM | POA: Diagnosis not present

## 2023-12-12 DIAGNOSIS — Z87898 Personal history of other specified conditions: Secondary | ICD-10-CM | POA: Diagnosis not present

## 2023-12-12 DIAGNOSIS — Z8669 Personal history of other diseases of the nervous system and sense organs: Secondary | ICD-10-CM | POA: Diagnosis not present

## 2023-12-12 DIAGNOSIS — E663 Overweight: Secondary | ICD-10-CM

## 2023-12-12 DIAGNOSIS — Z13228 Encounter for screening for other metabolic disorders: Secondary | ICD-10-CM | POA: Diagnosis not present

## 2023-12-12 LAB — POCT URINALYSIS DIP (CLINITEK)
Bilirubin, UA: NEGATIVE
Blood, UA: NEGATIVE
Glucose, UA: NEGATIVE mg/dL
Ketones, POC UA: NEGATIVE mg/dL
Leukocytes, UA: NEGATIVE
Nitrite, UA: NEGATIVE
POC PROTEIN,UA: NEGATIVE
Spec Grav, UA: 1.01 (ref 1.010–1.025)
Urobilinogen, UA: 0.2 U/dL
pH, UA: 7 (ref 5.0–8.0)

## 2023-12-12 MED ORDER — LISINOPRIL 20 MG PO TABS: 20.0000 mg | ORAL_TABLET | Freq: Every day | ORAL | 1 refills | Status: AC

## 2023-12-12 NOTE — Assessment & Plan Note (Signed)
 BMI indicates overweight. Reports 8-pound weight loss. Limited physical activity due to work. - Encouraged continued weight management efforts.

## 2023-12-12 NOTE — Progress Notes (Signed)
 LILLETTE Kristeen JINNY Gladis, CMA,acting as a neurosurgeon for Gaines Ada, FNP.,have documented all relevant documentation on the behalf of Gaines Ada, FNP,as directed by  Gaines Ada, FNP while in the presence of Gaines Ada, FNP.  Subjective:   Patient ID: Kenneth Wilkerson , male    DOB: Jan 19, 1961 , 63 y.o.   MRN: [id number]  Chief Complaint  Patient presents with   Annual Exam    Patient presents today for HM, Patient reports compliance with medication. Patient denies any chest pain, SOB, or headaches. Patient has no concerns today.     HPI  Discussed the use of AI scribe software for clinical note transcription with the patient, who gave verbal consent to proceed.  History of Present Illness Kenneth Wilkerson is a 63 year old male who presents for an annual physical exam.  He has a history of hypertension and is currently taking lisinopril  10 mg daily. He notes that his blood pressure readings at home are sometimes higher in the afternoon, and he recalls previously being on a 20 mg dose. He is concerned about his blood pressure being 'really high' in the afternoon.  He describes experiencing eye twitching and tingling sensations, particularly in the mornings, which started about a month ago. These symptoms are not present every day. He recalls a similar sensation occurring over the past three years.  He mentions a past episode in November 2021, initially described as a 'heart attack' but later clarified as a seizure-like event with symptoms of syncope per the patient.  He was evaluated in the hospital and they did not feel as though it was seizure like it was explained as syncope and thought to be a vasovagal response and a CT scan was performed, which was normal. He has not seen a neurologist despite being referred in 2023 for headaches.  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 He does not engage in additional physical exercise due to his work  involving lifting weights. His diet consists mainly of home-cooked meals, including beans and rice, and he avoids eating out due to concerns about high salt content affecting his blood pressure. No constipation, diarrhea, or urinary issues.  Past Medical History:  Diagnosis Date   Elevated bilirubin 04/07/2013   Erythrocytosis 04/07/2013   Hemorrhoids    Hyperlipidemia    Hypertension    Tuberculosis    Treated for in 2000   Vitamin D  deficiency      Family History  Family history unknown: Yes     Current Outpatient Medications:    ibuprofen  (ADVIL ) 600 MG tablet, Take 1 tablet (600 mg total) by mouth every 6 (six) hours as needed., Disp: 60 tablet, Rfl: 0   fluticasone  (FLONASE ) 50 MCG/ACT nasal spray, Place 2 sprays into both nostrils daily. (Patient not taking: Reported on 12/12/2023), Disp: 16 g, Rfl: 6   lisinopril  (ZESTRIL ) 20 MG tablet, Take 1 tablet (20 mg total) by mouth daily., Disp: 90 tablet, Rfl: 1   Allergies  Allergen Reactions   Amoxicillin  Other (See Comments)    Shaking/ tremors   Darvocet [Propoxyphene N-Acetaminophen ] Other (See Comments)    HA, NERVOUSNESS   Darvon [Propoxyphene]    Sulfa Antibiotics Other (See Comments)    Dizziness, shakey, sweats   Sulfa Antibiotics      Men's preventive visit. Patient Health Questionnaire (PHQ-2) is  Flowsheet Row Office Visit from 06/24/2023 in [facility]  PHQ-2 Total Score 0  Patient is on a Regular diet; feels like he eats healthy with beans and rice. He doesn't eat out much. 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 Marital status: Legally Separated. Relevant history for alcohol use is:  Social History   Substance and Sexual Activity  Alcohol Use Yes   Comment: occ 3 times a week   Relevant history for tobacco use is:  Social History   Tobacco Use  Smoking Status Never  Smokeless Tobacco Never  .   Review of Systems  Constitutional:  Negative.   HENT: Negative.    Eyes: Negative.   Respiratory: Negative.    Cardiovascular: Negative.   Gastrointestinal: Negative.   Endocrine: Negative.   Genitourinary: Negative.   Musculoskeletal: Negative.   Allergic/Immunologic: Negative.   Neurological: Negative.   Hematological: Negative.   Psychiatric/Behavioral: Negative.       Today's Vitals   12/12/23 0826  BP: 110/78  Pulse: 85  Temp: 98.2 F (36.8 C)  TempSrc: Oral  Weight: 178 lb (80.7 kg)  Height: 5' 7 (1.702 m)  PainSc: 0-No pain   Body mass index is 27.88 kg/m.  Wt Readings from Last 3 Encounters:  12/12/23 178 lb (80.7 kg)  07/13/23 185 lb (83.9 kg)  06/24/23 183 lb (83 kg)    Objective:  Physical Exam Vitals and nursing note reviewed.  Constitutional:      Appearance: Normal appearance. He is obese.  HENT:     Head: Normocephalic and atraumatic.     Right Ear: Tympanic membrane, ear canal and external ear normal. There is no impacted cerumen.     Left Ear: Tympanic membrane, ear canal and external ear normal. There is no impacted cerumen.     Nose: Nose normal.     Mouth/Throat:     Mouth: Mucous membranes are moist.  Cardiovascular:     Rate and Rhythm: Normal rate and regular rhythm.     Pulses: Normal pulses.     Heart sounds: Normal heart sounds. No murmur heard. Pulmonary:     Effort: Pulmonary effort is normal. No respiratory distress.     Breath sounds: Normal breath sounds. No wheezing.  Abdominal:     General: Abdomen is flat. Bowel sounds are normal. There is no distension.     Palpations: Abdomen is soft.  Genitourinary:    Prostate: Normal.     Rectum: Guaiac result negative.  Musculoskeletal:        General: Normal range of motion.     Cervical back: Normal range of motion and neck supple.  Skin:    General: Skin is warm.     Capillary Refill: Capillary refill takes less than 2 seconds.  Neurological:     General: No focal deficit present.     Mental Status: He is  alert and oriented to person, place, and time.  Psychiatric:        Mood and Affect: Mood normal.        Behavior: Behavior normal.        Thought Content: Thought content normal.        Judgment: Judgment normal.         Assessment And Plan:    Encounter for annual health examination Assessment & Plan: Routine wellness visit with stable blood pressure and weight loss. No gastrointestinal or urinary issues. No prostate cancer family history. Colonoscopy due in 2029. - Ordered blood work. - Ensure voicemail available for neurology appointment scheduling.  Orders: -  CBC with Differential/Platelet  Essential hypertension Assessment & Plan: Hypertension managed with lisinopril . Afternoon blood pressure elevation noted. EKG normal. - Increased lisinopril  to 20 mg daily. - Provided 90-day supply with refill. - EKG done with NSR   Orders: -     EKG 12-Lead -     POCT URINALYSIS DIP (CLINITEK) -     Microalbumin / creatinine urine ratio -     CBC with Differential/Platelet -     CMP14+EGFR -     Lisinopril ; Take 1 tablet (20 mg total) by mouth daily.  Dispense: 90 tablet; Refill: 1  Influenza vaccination declined  Tetanus, diphtheria, and acellular pertussis (Tdap) vaccination declined  Encounter for screening for metabolic disorder -     Hemoglobin A1c  Encounter for lipid screening for cardiovascular disease -     Lipid panel  Encounter for prostate cancer screening -     PSA  Eye twitch Assessment & Plan: Intermittent eye twitching for three years, stress-related. No prior neurology consultation. - Ordered thyroid function tests. - Referred to neurology for evaluation.  Orders: -     TSH -     Ambulatory referral to Neurology -     Vitamin B12  History of migraine -     Ambulatory referral to Neurology -     Vitamin B12  History of syncope -     Ambulatory referral to Neurology -     Vitamin B12  Overweight with body mass index (BMI) of 27 to 27.9 in  adult Assessment & Plan: BMI indicates overweight. Reports 8-pound weight loss. Limited physical activity due to work. - Encouraged continued weight management efforts.     Return for 1 year physical, 6 month bp check. Patient was given opportunity to ask questions. Patient verbalized understanding of the plan and was able to repeat key elements of the plan. All questions were answered to their satisfaction.   Gaines Ada, FNP  I, Gaines Ada, FNP, have reviewed all documentation for this visit. The documentation on 12/12/23 for the exam, diagnosis, procedures, and orders are all accurate and complete.

## 2023-12-12 NOTE — Assessment & Plan Note (Signed)
 Routine wellness visit with stable blood pressure and weight loss. No gastrointestinal or urinary issues. No prostate cancer family history. Colonoscopy due in 2029. - Ordered blood work. - Ensure voicemail available for neurology appointment scheduling.

## 2023-12-12 NOTE — Patient Instructions (Addendum)
 Health Maintenance  Topic Date Due   Colon Cancer Screening  Never done   COVID-19 Vaccine (1 - 2025-26 season) 12/28/2023*   Zoster (Shingles) Vaccine (1 of 2) 03/13/2024*   Flu Shot  04/21/2024*   DTaP/Tdap/Td vaccine (2 - Td or Tdap) 12/11/2024*   Pneumococcal Vaccine for age over 83 (1 of 1 - PCV) 12/11/2024*   Hepatitis C Screening  Completed   HIV Screening  Completed   Hepatitis B Vaccine  Aged Out   HPV Vaccine  Aged Out   Meningitis B Vaccine  Aged Out  *Topic was postponed. The date shown is not the original due date.

## 2023-12-12 NOTE — Assessment & Plan Note (Addendum)
 Hypertension managed with lisinopril . Afternoon blood pressure elevation noted. EKG normal. - Increased lisinopril  to 20 mg daily. - Provided 90-day supply with refill. - EKG done with NSR

## 2023-12-12 NOTE — Assessment & Plan Note (Signed)
 Intermittent eye twitching for three years, stress-related. No prior neurology consultation. - Ordered thyroid function tests. - Referred to neurology for evaluation.

## 2023-12-13 LAB — CMP14+EGFR
ALT: 33 IU/L (ref 0–44)
AST: 29 IU/L (ref 0–40)
Albumin: 4.5 g/dL (ref 3.9–4.9)
Alkaline Phosphatase: 87 IU/L (ref 47–123)
BUN/Creatinine Ratio: 18 (ref 10–24)
BUN: 14 mg/dL (ref 8–27)
Bilirubin Total: 1.1 mg/dL (ref 0.0–1.2)
CO2: 23 mmol/L (ref 20–29)
Calcium: 9.6 mg/dL (ref 8.6–10.2)
Chloride: 98 mmol/L (ref 96–106)
Creatinine, Ser: 0.76 mg/dL (ref 0.76–1.27)
Globulin, Total: 3.3 g/dL (ref 1.5–4.5)
Glucose: 98 mg/dL (ref 70–99)
Potassium: 4.3 mmol/L (ref 3.5–5.2)
Sodium: 136 mmol/L (ref 134–144)
Total Protein: 7.8 g/dL (ref 6.0–8.5)
eGFR: 102 mL/min/1.73 (ref >59)

## 2023-12-13 LAB — CBC WITH DIFFERENTIAL/PLATELET
Basophils Absolute: 0 x10E3/uL (ref 0.0–0.2)
Basos: 1 %
EOS (ABSOLUTE): 0.1 x10E3/uL (ref 0.0–0.4)
Eos: 2 %
Hematocrit: 53.1 % — ABNORMAL HIGH (ref 37.5–51.0)
Hemoglobin: 17.4 g/dL (ref 13.0–17.7)
Immature Grans (Abs): 0 x10E3/uL (ref 0.0–0.1)
Immature Granulocytes: 0 %
Lymphocytes Absolute: 3 x10E3/uL (ref 0.7–3.1)
Lymphs: 40 %
MCH: 32 pg (ref 26.6–33.0)
MCHC: 32.8 g/dL (ref 31.5–35.7)
MCV: 98 fL — ABNORMAL HIGH (ref 79–97)
Monocytes Absolute: 0.7 x10E3/uL (ref 0.1–0.9)
Monocytes: 10 %
Neutrophils Absolute: 3.7 x10E3/uL (ref 1.4–7.0)
Neutrophils: 47 %
Platelets: 239 x10E3/uL (ref 150–450)
RBC: 5.43 x10E6/uL (ref 4.14–5.80)
RDW: 12.6 % (ref 11.6–15.4)
WBC: 7.5 x10E3/uL (ref 3.4–10.8)

## 2023-12-13 LAB — HEMOGLOBIN A1C
Est. average glucose Bld gHb Est-mCnc: 103 mg/dL
Hgb A1c MFr Bld: 5.2 % (ref 4.8–5.6)

## 2023-12-13 LAB — PSA: Prostate Specific Ag, Serum: 0.7 ng/mL (ref 0.0–4.0)

## 2023-12-13 LAB — MICROALBUMIN / CREATININE URINE RATIO
Creatinine, Urine: 19.4 mg/dL
Microalb/Creat Ratio: 16 mg/g{creat} (ref 0–29)
Microalbumin, Urine: 3.1 ug/mL

## 2023-12-13 LAB — LIPID PANEL
Chol/HDL Ratio: 3.5 ratio (ref 0.0–5.0)
Cholesterol, Total: 159 mg/dL (ref 100–199)
HDL: 45 mg/dL (ref >39)
LDL Chol Calc (NIH): 100 mg/dL — ABNORMAL HIGH (ref 0–99)
Triglycerides: 71 mg/dL (ref 0–149)
VLDL Cholesterol Cal: 14 mg/dL (ref 5–40)

## 2023-12-13 LAB — VITAMIN B12: Vitamin B-12: 915 pg/mL (ref 232–1245)

## 2023-12-13 LAB — TSH: TSH: 1.97 u[IU]/mL (ref 0.450–4.500)

## 2024-05-14 ENCOUNTER — Ambulatory Visit: Admitting: Neurology

## 2024-06-10 ENCOUNTER — Ambulatory Visit: Admitting: Nurse Practitioner

## 2024-12-15 ENCOUNTER — Encounter: Payer: Self-pay | Admitting: Nurse Practitioner
